# Patient Record
Sex: Female | Born: 1990 | Race: White | Hispanic: No | Marital: Single | Smoking: Former smoker
Health system: Southern US, Community
[De-identification: ages and names within clinical notes are randomized; demographics above are authoritative.]

## PROBLEM LIST (undated history)

## (undated) ENCOUNTER — Inpatient Hospital Stay (HOSPITAL_COMMUNITY): Payer: Self-pay

## (undated) DIAGNOSIS — R87629 Unspecified abnormal cytological findings in specimens from vagina: Secondary | ICD-10-CM

## (undated) HISTORY — PX: NO PAST SURGERIES: SHX2092

---

## 2011-06-15 ENCOUNTER — Emergency Department (INDEPENDENT_AMBULATORY_CARE_PROVIDER_SITE_OTHER): Payer: No Typology Code available for payment source

## 2011-06-15 ENCOUNTER — Encounter: Payer: Self-pay | Admitting: *Deleted

## 2011-06-15 ENCOUNTER — Emergency Department (HOSPITAL_BASED_OUTPATIENT_CLINIC_OR_DEPARTMENT_OTHER)
Admission: EM | Admit: 2011-06-15 | Discharge: 2011-06-15 | Disposition: A | Discharge status: Home / Self Care | Payer: No Typology Code available for payment source | Attending: Emergency Medicine | Admitting: Emergency Medicine

## 2011-06-15 DIAGNOSIS — S61411A Laceration without foreign body of right hand, initial encounter: Secondary | ICD-10-CM

## 2011-06-15 DIAGNOSIS — R079 Chest pain, unspecified: Secondary | ICD-10-CM

## 2011-06-15 DIAGNOSIS — M25449 Effusion, unspecified hand: Secondary | ICD-10-CM

## 2011-06-15 DIAGNOSIS — Y92410 Unspecified street and highway as the place of occurrence of the external cause: Secondary | ICD-10-CM | POA: Insufficient documentation

## 2011-06-15 DIAGNOSIS — S61409A Unspecified open wound of unspecified hand, initial encounter: Secondary | ICD-10-CM

## 2011-06-15 DIAGNOSIS — S20219A Contusion of unspecified front wall of thorax, initial encounter: Secondary | ICD-10-CM | POA: Insufficient documentation

## 2011-06-15 DIAGNOSIS — J45909 Unspecified asthma, uncomplicated: Secondary | ICD-10-CM | POA: Insufficient documentation

## 2011-06-15 MED ORDER — HYDROCODONE-ACETAMINOPHEN 5-325 MG PO TABS: 2.0000 | ORAL_TABLET | ORAL | Status: AC | PRN

## 2011-06-15 MED ORDER — LIDOCAINE HCL (PF) 1 % IJ SOLN
30.0000 mL | Freq: Once | INTRAMUSCULAR | Status: AC
  Administered 2011-06-15: 5 mL via INTRADERMAL

## 2011-06-15 MED ORDER — LIDOCAINE HCL (PF) 1 % IJ SOLN
INTRAMUSCULAR | Status: AC
  Filled 2011-06-15: qty 5

## 2011-06-15 NOTE — ED Notes (Signed)
PA at bedside.

## 2011-06-15 NOTE — ED Provider Notes (Signed)
Medical screening examination/treatment/procedure(s) were performed by non-physician practitioner and as supervising physician I was immediately available for consultation/collaboration.   Seraphim Affinito M Michaelangelo Mittelman, MD 06/15/11 2317 
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
## 2011-06-23 ENCOUNTER — Encounter (HOSPITAL_BASED_OUTPATIENT_CLINIC_OR_DEPARTMENT_OTHER): Payer: Self-pay | Admitting: Emergency Medicine

## 2011-06-23 ENCOUNTER — Emergency Department (HOSPITAL_BASED_OUTPATIENT_CLINIC_OR_DEPARTMENT_OTHER)
Admission: EM | Admit: 2011-06-23 | Discharge: 2011-06-23 | Disposition: A | Discharge status: Home / Self Care | Payer: No Typology Code available for payment source | Attending: Emergency Medicine | Admitting: Emergency Medicine

## 2011-06-23 DIAGNOSIS — Z79899 Other long term (current) drug therapy: Secondary | ICD-10-CM | POA: Insufficient documentation

## 2011-06-23 DIAGNOSIS — Z4802 Encounter for removal of sutures: Secondary | ICD-10-CM | POA: Insufficient documentation

## 2011-06-23 DIAGNOSIS — J45909 Unspecified asthma, uncomplicated: Secondary | ICD-10-CM | POA: Insufficient documentation

## 2011-06-23 NOTE — ED Notes (Signed)
Hand sutured site intact

## 2011-06-23 NOTE — ED Provider Notes (Signed)
History     CSN: [id number] Arrival date & time: 06/23/2011  3:32 PM   First MD Initiated Contact with Patient 06/23/11 1538      Chief Complaint  Patient presents with  . Suture / Staple Removal    sutures intact to R hand site unremarkable    (Consider location/radiation/quality/duration/timing/severity/associated sxs/prior treatment) Patient is a 20 y.o. female presenting with suture removal. The history is provided by the patient. No language interpreter was used.  Suture / Staple Removal  The sutures were placed 7 to 10 days ago. Her temperature was unmeasured prior to arrival. There has been no drainage from the wound. There is no redness present. There is no swelling present.    Past Medical History  Diagnosis Date  . Asthma     History reviewed. No pertinent past surgical history.  History reviewed. No pertinent family history.  History  Substance Use Topics  . Smoking status: Former Games developer  . Smokeless tobacco: Not on file  . Alcohol Use: No    OB History    Grav Para Term Preterm Abortions TAB SAB Ect Mult Living                  Review of Systems  Respiratory: Negative.   Cardiovascular: Negative.   Skin:       Pt here to have sutures removed to the right hand    Allergies  Review of patient's allergies indicates no known allergies.  Home Medications   Current Outpatient Rx  Name Route Sig Dispense Refill  . BUPROPION HCL ER (XL) 300 MG PO TB24 Oral Take 300 mg by mouth daily.      Marland Kitchen FLUOXETINE HCL 20 MG PO CAPS Oral Take 20 mg by mouth daily.      Marland Kitchen HYDROCODONE-ACETAMINOPHEN 5-325 MG PO TABS Oral Take 2 tablets by mouth every 4 (four) hours as needed for pain. 10 tablet 0  . NORGESTIM-ETH ESTRAD TRIPHASIC 0.18/0.215/0.25 MG-35 MCG PO TABS Oral Take 1 tablet by mouth daily.      . BUPROPION HCL ER (SR) 100 MG PO TB12 Oral Take 100 mg by mouth 2 (two) times daily.      . BUPROPION HCL ER (XL) 150 MG PO TB24 Oral Take 150 mg by mouth daily.        Marland Kitchen FLUOXETINE HCL 10 MG PO CAPS Oral Take 10 mg by mouth daily.        BP 123/80  Pulse 88  Temp 97.8 F (36.6 C)  Resp 20  SpO2 98%  LMP 05/27/2011  Physical Exam  Nursing note and vitals reviewed. Constitutional: She appears well-developed and well-nourished.  Cardiovascular: Normal rate and regular rhythm.   Pulmonary/Chest: Effort normal and breath sounds normal.  Musculoskeletal:       Pt has well healed wound to the right hand    ED Course  SUTURE REMOVAL Performed by: Teressa Lower Authorized by: Teressa Lower Consent given by: patient Body area: upper extremity Location details: right hand Wound Appearance: clean Sutures Removed: 2   (including critical care time)  Labs Reviewed - No data to display No results found.   1. Visit for suture removal       MDM  Sutures removed without any problem        Teressa Lower, NP 06/23/11 1619

## 2011-06-28 NOTE — ED Provider Notes (Signed)
Medical screening examination/treatment/procedure(s) were performed by non-physician practitioner and as supervising physician I was immediately available for consultation/collaboration.  Mang Hazelrigg W. Tana Trefry, MD 06/28/11 0929 

## 2018-01-04 ENCOUNTER — Encounter (HOSPITAL_COMMUNITY): Payer: Self-pay | Admitting: *Deleted

## 2018-01-04 ENCOUNTER — Inpatient Hospital Stay (HOSPITAL_COMMUNITY)
Admission: AD | Admit: 2018-01-04 | Discharge: 2018-01-04 | Disposition: A | Discharge status: Home / Self Care | Payer: Medicaid Other | Source: Ambulatory Visit | Attending: Obstetrics & Gynecology | Admitting: Obstetrics & Gynecology

## 2018-01-04 ENCOUNTER — Inpatient Hospital Stay (HOSPITAL_COMMUNITY): Payer: Medicaid Other

## 2018-01-04 DIAGNOSIS — O209 Hemorrhage in early pregnancy, unspecified: Secondary | ICD-10-CM | POA: Diagnosis not present

## 2018-01-04 DIAGNOSIS — Z3A01 Less than 8 weeks gestation of pregnancy: Secondary | ICD-10-CM | POA: Insufficient documentation

## 2018-01-04 DIAGNOSIS — M25511 Pain in right shoulder: Secondary | ICD-10-CM | POA: Diagnosis not present

## 2018-01-04 DIAGNOSIS — Z87891 Personal history of nicotine dependence: Secondary | ICD-10-CM | POA: Insufficient documentation

## 2018-01-04 DIAGNOSIS — O30041 Twin pregnancy, dichorionic/diamniotic, first trimester: Secondary | ICD-10-CM | POA: Diagnosis not present

## 2018-01-04 DIAGNOSIS — R1031 Right lower quadrant pain: Secondary | ICD-10-CM | POA: Insufficient documentation

## 2018-01-04 LAB — URINALYSIS, ROUTINE W REFLEX MICROSCOPIC
Bilirubin Urine: NEGATIVE
Glucose, UA: NEGATIVE mg/dL
KETONES UR: NEGATIVE mg/dL
LEUKOCYTES UA: NEGATIVE
Nitrite: NEGATIVE
PROTEIN: NEGATIVE mg/dL
Specific Gravity, Urine: 1.01 (ref 1.005–1.030)
pH: 6 (ref 5.0–8.0)

## 2018-01-04 LAB — CBC
HCT: 35.9 % — ABNORMAL LOW (ref 36.0–46.0)
Hemoglobin: 12.8 g/dL (ref 12.0–15.0)
MCH: 32.6 pg (ref 26.0–34.0)
MCHC: 35.7 g/dL (ref 30.0–36.0)
MCV: 91.3 fL (ref 78.0–100.0)
Platelets: 220 10*3/uL (ref 150–400)
RBC: 3.93 MIL/uL (ref 3.87–5.11)
RDW: 11.8 % (ref 11.5–15.5)
WBC: 8.8 10*3/uL (ref 4.0–10.5)

## 2018-01-04 LAB — ABO/RH: ABO/RH(D): A POS

## 2018-01-04 LAB — WET PREP, GENITAL
Clue Cells Wet Prep HPF POC: NONE SEEN
Sperm: NONE SEEN
TRICH WET PREP: NONE SEEN
YEAST WET PREP: NONE SEEN

## 2018-01-04 LAB — POCT PREGNANCY, URINE: PREG TEST UR: POSITIVE — AB

## 2018-01-04 LAB — HCG, QUANTITATIVE, PREGNANCY: hCG, Beta Chain, Quant, S: 22659 m[IU]/mL — ABNORMAL HIGH (ref <5)

## 2018-01-04 NOTE — MAU Note (Signed)
Pt reports she is [redacted] weeks pregnant, spotting off/on x 2 weeks, right shoulder pain x one week and cramping for 24 hours.

## 2018-01-04 NOTE — MAU Provider Note (Signed)
History     CSN: [id number]  Arrival date and time: 01/04/18 1438   First Provider Initiated Contact with Patient 01/04/18 1628      Chief Complaint  Patient presents with  . Abdominal Pain  . Vaginal Bleeding  . Shoulder Pain   HPI Crystal Terry 27 y.o. [redacted]w[redacted]d by sure LMP.  Has been having pink bleeding when she wipes every few days for the past 6 weeks.  Today, she has had RLQ pain 7/10 and pain in her right shoulder.  She was worried and come in for evaluation.  She has applied for Medicaid but does not yet have her card.  She does not yet have an appointment for prenatal care.  Has been having some nausea but no vomiting.  Client is concerned that her symptoms are indicative of ectopic pregnancy.  OB History    Gravida  1   Para      Term      Preterm      AB      Living        SAB      TAB      Ectopic      Multiple      Live Births              Past Medical History:  Diagnosis Date  . Asthma     History reviewed. No pertinent surgical history.  History reviewed. No pertinent family history.  Social History   Tobacco Use  . Smoking status: Former Games developer  . Smokeless tobacco: Never Used  Substance Use Topics  . Alcohol use: No  . Drug use: No    Allergies: No Known Allergies  Medications Prior to Admission  Medication Sig Dispense Refill Last Dose  . buPROPion (WELLBUTRIN SR) 100 MG 12 hr tablet Take 100 mg by mouth 2 (two) times daily.       Marland Kitchen buPROPion (WELLBUTRIN XL) 150 MG 24 hr tablet Take 150 mg by mouth daily.     06/15/2011 at Unknown  . buPROPion (WELLBUTRIN XL) 300 MG 24 hr tablet Take 300 mg by mouth daily.     06/23/2011 at Unknown  . FLUoxetine (PROZAC) 10 MG capsule Take 10 mg by mouth daily.     06/15/2011 at Unknown  . FLUoxetine (PROZAC) 20 MG capsule Take 20 mg by mouth daily.     06/23/2011 at Unknown  . Norgestimate-Ethinyl Estradiol Triphasic (ORTHO TRI-CYCLEN, 28,) 0.18/0.215/0.25 MG-35 MCG tablet Take 1 tablet by  mouth daily.     Past Week at Unknown    Review of Systems  Constitutional: Negative for fever.  Gastrointestinal: Positive for abdominal pain and nausea. Negative for diarrhea and vomiting.  Genitourinary: Positive for vaginal bleeding. Negative for dysuria and vaginal discharge.  Musculoskeletal:       Pain in right shoulder   Physical Exam   Blood pressure 126/66, pulse 77, temperature 98.6 F (37 C), temperature source Oral, resp. rate 15, height 6' (1.829 m), weight 144 lb (65.3 kg), last menstrual period 11/11/2017, SpO2 100 %.  Physical Exam  Nursing note and vitals reviewed. Constitutional: She is oriented to person, place, and time. She appears well-developed and well-nourished.  HENT:  Head: Normocephalic.  Eyes: EOM are normal.  Neck: Neck supple.  GI: Soft. There is tenderness. There is no rebound and no guarding.  Pain in RLQ with gentle palpation  Genitourinary:  Genitourinary Comments: Speculum exam: Vagina - Small amount of white discharge, no odor, no  blood seen Cervix - No contact bleeding Bimanual exam: Cervix closed and thick Uterus non tender, normal size Adnexa slightly tender on the right side, no tenderness on the left side, no masses bilaterally GC/Chlam, wet prep done Chaperone present for exam.   Musculoskeletal: Normal range of motion.  Neurological: She is alert and oriented to person, place, and time.  Skin: Skin is warm and dry.  Psychiatric: She has a normal mood and affect.    MAU Course  Procedures Results for orders placed or performed during the hospital encounter of 01/04/18 (from the past 24 hour(s))  Urinalysis, Routine w reflex microscopic     Status: Abnormal   Collection Time: 01/04/18  3:25 PM  Result Value Ref Range   Color, Urine YELLOW YELLOW   APPearance HAZY (A) CLEAR   Specific Gravity, Urine 1.010 1.005 - 1.030   pH 6.0 5.0 - 8.0   Glucose, UA NEGATIVE NEGATIVE mg/dL   Hgb urine dipstick SMALL (A) NEGATIVE    Bilirubin Urine NEGATIVE NEGATIVE   Ketones, ur NEGATIVE NEGATIVE mg/dL   Protein, ur NEGATIVE NEGATIVE mg/dL   Nitrite NEGATIVE NEGATIVE   Leukocytes, UA NEGATIVE NEGATIVE   RBC / HPF 0-5 0 - 5 RBC/hpf   WBC, UA 0-5 0 - 5 WBC/hpf   Bacteria, UA MANY (A) NONE SEEN   Squamous Epithelial / LPF 0-5 0 - 5  Pregnancy, urine POC     Status: Abnormal   Collection Time: 01/04/18  3:38 PM  Result Value Ref Range   Preg Test, Ur POSITIVE (A) NEGATIVE  Wet prep, genital     Status: Abnormal   Collection Time: 01/04/18  4:41 PM  Result Value Ref Range   Yeast Wet Prep HPF POC NONE SEEN NONE SEEN   Trich, Wet Prep NONE SEEN NONE SEEN   Clue Cells Wet Prep HPF POC NONE SEEN NONE SEEN   WBC, Wet Prep HPF POC MODERATE (A) NONE SEEN   Sperm NONE SEEN   CBC     Status: Abnormal   Collection Time: 01/04/18  4:44 PM  Result Value Ref Range   WBC 8.8 4.0 - 10.5 K/uL   RBC 3.93 3.87 - 5.11 MIL/uL   Hemoglobin 12.8 12.0 - 15.0 g/dL   HCT 78.2 (L) 95.6 - 21.3 %   MCV 91.3 78.0 - 100.0 fL   MCH 32.6 26.0 - 34.0 pg   MCHC 35.7 30.0 - 36.0 g/dL   RDW 08.6 57.8 - 46.9 %   Platelets 220 150 - 400 K/uL  hCG, quantitative, pregnancy     Status: Abnormal   Collection Time: 01/04/18  4:44 PM  Result Value Ref Range   hCG, Beta Chain, Quant, S 22,659 (H) <5 mIU/mL  ABO/Rh     Status: None (Preliminary result)   Collection Time: 01/04/18  4:44 PM  Result Value Ref Range   ABO/RH(D)      A POS Performed at [facility], [address], [place], [place] [address]      CLINICAL DATA:  First trimester of pregnancy, vaginal bleeding.  EXAM: TWIN OBSTETRIC <14WK [place] AND TRANSVAGINAL OB US  COMPARISON:  None.  FINDINGS: Number of IUPs:  2  Chorionicity/Amnionicity:  Dichorionic-diamniotic (thick membrane)  TWIN 1  Yolk sac:  Not visualized.  Embryo:  Not visualized.  Cardiac Activity: Not visualized.  MSD: 14 mm   6 w   2 d  TWIN 2  Yolk sac:  Not visualized.  Embryo:   Visualized.  Cardiac Activity: Visualized.  Heart Rate: 111 bpm  CRL:  7 mm   6 w 3 d                  [place] EDC: August 27, 2018.  Subchorionic hemorrhage:  None visualized.  Maternal uterus/adnexae: Ovaries appear normal. No free fluid is noted.  IMPRESSION: Twin pregnancy is noted.  Twin 1: Probable intrauterine gestational sac, but no yolk sac, fetal pole, or cardiac activity yet visualized. Recommend follow-up quantitative B-HCG levels and follow-up US in 14 days to assess viability. This recommendation follows SRU consensus guidelines: Diagnostic Criteria for Nonviable Pregnancy Early in the First Trimester. Malva Limes Med 2013; 308:6578-46.  Twin 2: Single live intrauterine gestation of 6 weeks 3 days.   MDM Discussed at length the ultrasound results with one living embryo with FHT of 111.  No reason for the vaginal bleeding identified. Blood type A positive and rhogam is not needed  Assessment and Plan  Twin gestation (Di-Di) with one sac with living embryo and one empty sac that is [redacted]w[redacted]d and not the [redacted]w[redacted]d pregnancy by her sure LMP Vaginal bleeding in first trimester - no known cause. Abdominal pain in pregnancy - no known cause Right shoulder pain  Plan Begin prenatal care as soon as possible  - message sent to clinic at [facility]. Continue prenatal vitamins you are taking. Return with heavy vaginal bleeding or worsening pain. Pelvic rest for now as there is no identifiable reason for the vaginal bleeding. Note for work given to limit lifting to 25 pounds as she is currently lifting over her head 50-100 pounds at work   This lifting may be contributing to her shoulder pain today.   Lendell Gallick L Lurene Robley 01/04/2018, 4:28 PM

## 2018-01-05 LAB — RPR: RPR: NONREACTIVE

## 2018-01-05 LAB — GC/CHLAMYDIA PROBE AMP (~~LOC~~) NOT AT ARMC
Chlamydia: NEGATIVE
NEISSERIA GONORRHEA: NEGATIVE

## 2018-01-05 LAB — HIV ANTIBODY (ROUTINE TESTING W REFLEX): HIV Screen 4th Generation wRfx: NONREACTIVE

## 2018-01-06 ENCOUNTER — Telehealth: Payer: Self-pay | Admitting: Obstetrics and Gynecology

## 2018-01-06 NOTE — Telephone Encounter (Signed)
Called patient, and left a detailed message about her appointment time and date.

## 2018-01-21 ENCOUNTER — Ambulatory Visit (INDEPENDENT_AMBULATORY_CARE_PROVIDER_SITE_OTHER): Payer: Medicaid Other | Admitting: Obstetrics and Gynecology

## 2018-01-21 ENCOUNTER — Encounter: Payer: Self-pay | Admitting: Obstetrics and Gynecology

## 2018-01-21 VITALS — BP 121/59 | HR 57 | Wt 146.1 lb

## 2018-01-21 DIAGNOSIS — Z113 Encounter for screening for infections with a predominantly sexual mode of transmission: Secondary | ICD-10-CM | POA: Diagnosis not present

## 2018-01-21 DIAGNOSIS — Z23 Encounter for immunization: Secondary | ICD-10-CM

## 2018-01-21 DIAGNOSIS — O0990 Supervision of high risk pregnancy, unspecified, unspecified trimester: Secondary | ICD-10-CM | POA: Insufficient documentation

## 2018-01-21 DIAGNOSIS — Z8279 Family history of other congenital malformations, deformations and chromosomal abnormalities: Secondary | ICD-10-CM | POA: Diagnosis not present

## 2018-01-21 DIAGNOSIS — Z124 Encounter for screening for malignant neoplasm of cervix: Secondary | ICD-10-CM

## 2018-01-21 DIAGNOSIS — Z3401 Encounter for supervision of normal first pregnancy, first trimester: Secondary | ICD-10-CM | POA: Diagnosis present

## 2018-01-21 DIAGNOSIS — O3121X0 Continuing pregnancy after intrauterine death of one fetus or more, first trimester, not applicable or unspecified: Secondary | ICD-10-CM

## 2018-01-21 DIAGNOSIS — O3110X0 Continuing pregnancy after spontaneous abortion of one fetus or more, unspecified trimester, not applicable or unspecified: Secondary | ICD-10-CM

## 2018-01-21 NOTE — Progress Notes (Signed)
Bedside US for viability:  IUGS w/1 fetal pole visualized, FHR - 170 bpm per M-mode. CRL - 2.23 cm  (8w 6d). Second smaller gestational sac visualized with no fetal pole present.

## 2018-01-22 DIAGNOSIS — O3110X0 Continuing pregnancy after spontaneous abortion of one fetus or more, unspecified trimester, not applicable or unspecified: Secondary | ICD-10-CM | POA: Insufficient documentation

## 2018-01-22 DIAGNOSIS — Z8279 Family history of other congenital malformations, deformations and chromosomal abnormalities: Secondary | ICD-10-CM | POA: Insufficient documentation

## 2018-01-22 NOTE — Progress Notes (Addendum)
New OB Note  01/21/2018   Clinic: [facility]  Chief Complaint: NOB  Transfer of Care Patient: no  History of Present Illness: Ms. Crystal Terry is a 27 y.o. G1P0000 @ 8/6 weeks (EDC 12/21, based on 6wk u/s)  Patient's last menstrual period was 11/11/2017.Preg complicated by has Supervision of low-risk first pregnancy; Vanishing twin syndrome; and Family history of fragile X syndrome on their problem list.   Any events prior to today's visit: yes:  Vanishing twin (di-di) Her periods were: qmonth, regular She was using no method when she conceived.  She has mild signs or symptoms of nausea/vomiting of pregnancy. She has Negative signs or symptoms of miscarriage or preterm labor On any medications around the time she conceived/early pregnancy: No   ROS: A 12-point review of systems was performed and negative, except as stated in the above HPI.  OBGYN History: As per HPI. OB History  Gravida Para Term Preterm AB Living  1 0 0 0 0 0  SAB TAB Ectopic Multiple Live Births  0 0 0 0 0    # Outcome Date GA Lbr Len/2nd Weight Sex Delivery Anes PTL Lv  1 Current             Any issues with any prior pregnancies: not applicable Prior children are healthy, doing well, and without any problems or issues: not applicable History of pap smears: No. Last pap smear unsure    Past Medical History: Past Medical History:  Diagnosis Date  . Asthma     Past Surgical History: Past Surgical History:  Procedure Laterality Date  . NO PAST SURGERIES      Family History:  Family History  Problem Relation Age of Onset  . Diabetes Maternal Grandmother   . Heart disease Maternal Grandmother   . Diabetes Maternal Grandfather   . Heart disease Maternal Grandfather    She denies any female cancers, bleeding or blood clotting disorders.  She denies any history of mental retardation, birth defects or genetic disorders in her or the FOB's history except for a cousin of her's with  fragile x.  Social History:  Social History   Socioeconomic History  . Marital status: Single    Spouse name: Not on file  . Number of children: Not on file  . Years of education: Not on file  . Highest education level: Not on file  Occupational History  . Not on file  Social Needs  . Financial resource strain: Not on file  . Food insecurity:    Worry: Not on file    Inability: Not on file  . Transportation needs:    Medical: Not on file    Non-medical: Not on file  Tobacco Use  . Smoking status: Former Games developer  . Smokeless tobacco: Never Used  Substance and Sexual Activity  . Alcohol use: No  . Drug use: No  . Sexual activity: Yes  Lifestyle  . Physical activity:    Days per week: Not on file    Minutes per session: Not on file  . Stress: Not on file  Relationships  . Social connections:    Talks on phone: Not on file    Gets together: Not on file    Attends religious service: Not on file    Active member of club or organization: Not on file    Attends meetings of clubs or organizations: Not on file    Relationship status: Not on file  . Intimate partner violence:  Fear of current or ex partner: Not on file    Emotionally abused: Not on file    Physically abused: Not on file    Forced sexual activity: Not on file  Other Topics Concern  . Not on file  Social History Narrative  . Not on file  pet works with pets  Allergy: No Known Allergies  Health Maintenance:  Mammogram Up to Date: not applicable  Current Outpatient Medications: PNV  Physical Exam:   BP (!) 121/59   Pulse (!) 57   Wt 146 lb 1.6 oz (66.3 kg)   LMP 11/11/2017   BMI 19.81 kg/m  Body mass index is 19.81 kg/m. Contractions: Not present Vag. Bleeding: None. Fundal height: not applicable FHTs: 170s  General appearance: Well nourished, well developed female in no acute distress.  Neck:  Supple, normal appearance, and no thyromegaly  Cardiovascular: S1, S2 normal, no murmur, rub  or gallop, regular rate and rhythm. HR 70s Respiratory:  Clear to auscultation bilateral. Normal respiratory effort Abdomen: positive bowel sounds and no masses, hernias; diffusely non tender to palpation, non distended Breasts: patient denies any abnormal breast s/s. Neuro/Psych:  Normal mood and affect.  Skin:  Warm and dry.  Lymphatic:  No inguinal lymphadenopathy.   Pelvic exam: is not limited by body habitus EGBUS: within normal limits, Vagina: within normal limits and with no blood in the vault, Cervix: normal appearing cervix without discharge or lesions, closed/long/high, Uterus:  8-10wk sized, and Adnexa:  normal adnexa and no mass, fullness, tenderness  Laboratory: MAU labs reviewed  Imaging:  SLIUP at 8-9wks on u/s today.   Assessment: pt doing well  Plan: 1. Encounter for supervision of low-risk first pregnancy in first trimester Routine care. Pt would like genetics. Will do NT only (no labs) due to vanishing twin. Work note given to avoid toxo exposure until ab testing comes back.  - Culture, OB Urine - Cystic fibrosis gene test - Cytology - PAP - Obstetric Panel, Including HIV - Hemoglobinopathy Evaluation - SMN1 COPY NUMBER ANALYSIS (SMA Carrier Screen) - Flu Vaccine QUAD 36+ mos IM - CHL AMB BABYSCRIPTS SCHEDULE OPTIMIZATION - Toxoplasma gondii antibody, IgG - [place] MFM Fetal Nuchal Translucency; Future - Fragile X, PCR reflex Southern  2. Family history of fragile X syndrome  - Fragile X, PCR reflex [place]  3. Vanishing twin syndrome - [place] MFM Fetal Nuchal Translucency; Future  Problem list reviewed and updated.  Follow up in 3 weeks.  The nature of [facility] with multiple MDs and other Advanced Practice Providers was explained to patient; also emphasized that residents, students are part of our team.  >50% of 25 min visit spent on counseling and coordination of care.     Cornelia Copa MD Attending Center  for [facility])

## 2018-01-24 LAB — CULTURE, OB URINE

## 2018-01-24 LAB — URINE CULTURE, OB REFLEX

## 2018-01-25 ENCOUNTER — Encounter: Payer: Self-pay | Admitting: Obstetrics and Gynecology

## 2018-01-25 DIAGNOSIS — R87612 Low grade squamous intraepithelial lesion on cytologic smear of cervix (LGSIL): Secondary | ICD-10-CM | POA: Insufficient documentation

## 2018-01-25 LAB — CYTOLOGY - PAP
CHLAMYDIA, DNA PROBE: NEGATIVE
Neisseria Gonorrhea: NEGATIVE

## 2018-01-27 LAB — FRAGILE X, PCR REFLEX SOUTHERN

## 2018-02-01 LAB — SMN1 COPY NUMBER ANALYSIS (SMA CARRIER SCREENING)

## 2018-02-01 LAB — OBSTETRIC PANEL, INCLUDING HIV
ANTIBODY SCREEN: NEGATIVE
BASOS: 1 %
Basophils Absolute: 0 10*3/uL (ref 0.0–0.2)
EOS (ABSOLUTE): 0.1 10*3/uL (ref 0.0–0.4)
Eos: 2 %
HEMATOCRIT: 34.9 % (ref 34.0–46.6)
HEMOGLOBIN: 11.8 g/dL (ref 11.1–15.9)
HEP B S AG: NEGATIVE
HIV Screen 4th Generation wRfx: NONREACTIVE
IMMATURE GRANS (ABS): 0 10*3/uL (ref 0.0–0.1)
IMMATURE GRANULOCYTES: 0 %
LYMPHS: 29 %
Lymphocytes Absolute: 2.2 10*3/uL (ref 0.7–3.1)
MCH: 32 pg (ref 26.6–33.0)
MCHC: 33.8 g/dL (ref 31.5–35.7)
MCV: 95 fL (ref 79–97)
MONOS ABS: 0.7 10*3/uL (ref 0.1–0.9)
Monocytes: 9 %
NEUTROS PCT: 59 %
Neutrophils Absolute: 4.7 10*3/uL (ref 1.4–7.0)
Platelets: 208 10*3/uL (ref 150–379)
RBC: 3.69 x10E6/uL — AB (ref 3.77–5.28)
RDW: 12 % — ABNORMAL LOW (ref 12.3–15.4)
RH TYPE: POSITIVE
RPR: NONREACTIVE
Rubella Antibodies, IGG: 1.54 index (ref >0.99)
WBC: 7.8 10*3/uL (ref 3.4–10.8)

## 2018-02-01 LAB — CYSTIC FIBROSIS GENE TEST

## 2018-02-01 LAB — HEMOGLOBINOPATHY EVALUATION
Ferritin: 56 ng/mL (ref 15–150)
HGB A2 QUANT: 2.4 % (ref 1.8–3.2)
HGB A: 97.6 % (ref 96.4–98.8)
HGB F QUANT: 0 % (ref 0.0–2.0)
HGB S: 0 %
HGB VARIANT: 0 %
Hgb C: 0 %
Hgb Solubility: NEGATIVE

## 2018-02-01 LAB — TOXOPLASMA GONDII ANTIBODY, IGG

## 2018-02-11 ENCOUNTER — Telehealth: Payer: Self-pay | Admitting: Obstetrics and Gynecology

## 2018-02-11 NOTE — Telephone Encounter (Signed)
OB Telephone Note  Patient called at [phone] and VM picked up as "Crystal Terry" VM left asking her to call the clinic.  Will need to tell patient re: need for colposcopy and that she is NOT toxoplasmosis immune, since she works at a [employer]. Can do her colposcopy with her next OB visit on 6/18.  [name], Jr MD Attending Center for [facility] 02/11/2018 Time: [id number]

## 2018-02-11 NOTE — Telephone Encounter (Signed)
OB Telephone Note  D/w pt re: need for colpo and that she is toxo non immune.  Crystal Terry, Jr MD Attending [facility] 02/11/2018 Time: [id number]

## 2018-02-14 ENCOUNTER — Encounter (HOSPITAL_COMMUNITY): Payer: Self-pay

## 2018-02-21 ENCOUNTER — Ambulatory Visit (HOSPITAL_COMMUNITY)
Admission: RE | Admit: 2018-02-21 | Discharge: 2018-02-21 | Disposition: A | Discharge status: Home / Self Care | Payer: Medicaid Other | Source: Ambulatory Visit | Attending: Obstetrics and Gynecology | Admitting: Obstetrics and Gynecology

## 2018-02-21 ENCOUNTER — Encounter (HOSPITAL_COMMUNITY): Payer: Self-pay

## 2018-02-21 DIAGNOSIS — O99511 Diseases of the respiratory system complicating pregnancy, first trimester: Secondary | ICD-10-CM | POA: Insufficient documentation

## 2018-02-21 DIAGNOSIS — Z3A13 13 weeks gestation of pregnancy: Secondary | ICD-10-CM | POA: Diagnosis not present

## 2018-02-21 DIAGNOSIS — O3110X0 Continuing pregnancy after spontaneous abortion of one fetus or more, unspecified trimester, not applicable or unspecified: Secondary | ICD-10-CM

## 2018-02-21 DIAGNOSIS — J45909 Unspecified asthma, uncomplicated: Secondary | ICD-10-CM | POA: Insufficient documentation

## 2018-02-21 DIAGNOSIS — Z3401 Encounter for supervision of normal first pregnancy, first trimester: Secondary | ICD-10-CM

## 2018-02-21 HISTORY — DX: Unspecified abnormal cytological findings in specimens from vagina: R87.629

## 2018-02-22 ENCOUNTER — Encounter: Payer: Self-pay | Admitting: Obstetrics and Gynecology

## 2018-03-02 ENCOUNTER — Ambulatory Visit (INDEPENDENT_AMBULATORY_CARE_PROVIDER_SITE_OTHER): Payer: Medicaid Other | Admitting: Obstetrics and Gynecology

## 2018-03-02 ENCOUNTER — Encounter: Payer: Self-pay | Admitting: Obstetrics and Gynecology

## 2018-03-02 VITALS — BP 132/80 | HR 101 | Wt 147.2 lb

## 2018-03-02 DIAGNOSIS — Z3400 Encounter for supervision of normal first pregnancy, unspecified trimester: Secondary | ICD-10-CM | POA: Diagnosis not present

## 2018-03-02 DIAGNOSIS — G4709 Other insomnia: Secondary | ICD-10-CM | POA: Diagnosis not present

## 2018-03-02 DIAGNOSIS — R87612 Low grade squamous intraepithelial lesion on cytologic smear of cervix (LGSIL): Secondary | ICD-10-CM | POA: Diagnosis not present

## 2018-03-02 MED ORDER — ZOLPIDEM TARTRATE 5 MG PO TABS: 5.0000 mg | ORAL_TABLET | Freq: Every evening | ORAL | 0 refills | Status: DC | PRN

## 2018-03-02 NOTE — Progress Notes (Signed)
Prenatal Visit Note Date: 03/02/2018 Clinic: [facility]  Subjective:  Ames Coupemily Depasquale is a 27 y.o. G1P0000 at 2358w4d being seen today for ongoing prenatal care.  She is currently monitored for the following issues for this low-risk pregnancy and has Supervision of low-risk first pregnancy; Vanishing twin syndrome; Family history of fragile X syndrome; and LGSIL on Pap smear of cervix on their problem list.  Patient reports insomnia.   Contractions: Not present. Vag. Bleeding: None.   . Denies leaking of fluid.   The following portions of the patient's history were reviewed and updated as appropriate: allergies, current medications, past family history, past medical history, past social history, past surgical history and problem list. Problem list updated.  Objective:   Vitals:   03/02/18 0936 03/02/18 0937  BP: (!) 142/82 132/80  Pulse: (!) 112 (!) 101  Weight: 147 lb 3.2 oz (66.8 kg)     Fetal Status: Fetal Heart Rate (bpm): 160         General:  Alert, oriented and cooperative. Patient is in no acute distress.  Skin: Skin is warm and dry. No rash noted.   Cardiovascular: Normal heart rate noted  Respiratory: Normal respiratory effort, no problems with respiration noted  Abdomen: Soft, gravid, appropriate for gestational age. Pain/Pressure: Absent     Pelvic:  Cervical exam deferred        Extremities: Normal range of motion.  Edema: None  Mental Status: Normal mood and affect. Normal behavior. Normal judgment and thought content.   Urinalysis:      Assessment and Plan:  Pregnancy: G1P0000 at 6858w4d  1. Encounter for supervision of low-risk first pregnancy, antepartum Routine care. Schedule anatomy u/s. Offer afp nv - US MFM OB COMP + 14 WK; Future  2. Other insomnia Had issues in the past. Issues with falling asleep and staying asleep. Caffeine only in the morning. Strategies d/w her and has tried benadryl but no help.   3. LGSIL on Pap smear of  cervix colpo done c/w cin 1. Repeat pp. No bx.   Preterm labor symptoms and general obstetric precautions including but not limited to vaginal bleeding, contractions, leaking of fluid and fetal movement were reviewed in detail with the patient. Please refer to After Visit Summary for other counseling recommendations.  Return for per babyscripts.   [employer] BingPickens, Neesha Langton, MD

## 2018-03-02 NOTE — Procedures (Signed)
Colposcopy Procedure Note  Pre-operative Diagnosis: LSIL. 14/4 weeks  Post-operative Diagnosis: Same. CIN 1  Procedure Details    The risks (including infection, bleeding, pain) and benefits of the procedure were explained to the patient and written informed consent was obtained.  The patient was placed in the dorsal lithotomy position. A Graves was speculum inserted in the vagina, and the cervix was visualized.  AA staining done Lugol's with green filter.  Biopsy not done and then single toothed tenaculum applied and ECC not done. No bleeding after procedure  Findings: AWE changes circumferentially c/w CIN 1  Adequate: yes  Specimens: none  Condition: Stable  Complications: None  Plan: The patient was advised to call for any fever or for prolonged or severe pain or bleeding. She was advised to use OTC analgesics as needed for mild to moderate pain. She was advised to avoid vaginal intercourse for 48 hours or until the bleeding has completely stopped.  D/w pt and recommend repeat colpo PP   Crystal Terry, Jr MD Attending [facility](Faculty Practice)

## 2018-03-11 ENCOUNTER — Other Ambulatory Visit (HOSPITAL_COMMUNITY): Payer: Self-pay

## 2018-03-18 ENCOUNTER — Encounter: Payer: Self-pay | Admitting: *Deleted

## 2018-04-04 ENCOUNTER — Other Ambulatory Visit (HOSPITAL_COMMUNITY): Payer: Self-pay | Admitting: Obstetrics and Gynecology

## 2018-04-04 ENCOUNTER — Ambulatory Visit (HOSPITAL_COMMUNITY)
Admission: RE | Admit: 2018-04-04 | Discharge: 2018-04-04 | Disposition: A | Discharge status: Home / Self Care | Payer: Medicaid Other | Source: Ambulatory Visit | Attending: Obstetrics and Gynecology | Admitting: Obstetrics and Gynecology

## 2018-04-04 ENCOUNTER — Other Ambulatory Visit: Payer: Self-pay | Admitting: Obstetrics and Gynecology

## 2018-04-04 ENCOUNTER — Other Ambulatory Visit (HOSPITAL_COMMUNITY): Payer: Self-pay

## 2018-04-04 ENCOUNTER — Encounter (HOSPITAL_COMMUNITY): Payer: Self-pay

## 2018-04-04 ENCOUNTER — Inpatient Hospital Stay (HOSPITAL_COMMUNITY): Admission: RE | Admit: 2018-04-04 | Payer: Medicaid Other | Source: Ambulatory Visit

## 2018-04-04 DIAGNOSIS — J45909 Unspecified asthma, uncomplicated: Secondary | ICD-10-CM | POA: Insufficient documentation

## 2018-04-04 DIAGNOSIS — O359XX0 Maternal care for (suspected) fetal abnormality and damage, unspecified, not applicable or unspecified: Secondary | ICD-10-CM

## 2018-04-04 DIAGNOSIS — O99512 Diseases of the respiratory system complicating pregnancy, second trimester: Secondary | ICD-10-CM | POA: Diagnosis not present

## 2018-04-04 DIAGNOSIS — Z3A19 19 weeks gestation of pregnancy: Secondary | ICD-10-CM | POA: Diagnosis present

## 2018-04-04 DIAGNOSIS — Z3400 Encounter for supervision of normal first pregnancy, unspecified trimester: Secondary | ICD-10-CM

## 2018-04-04 DIAGNOSIS — Z363 Encounter for antenatal screening for malformations: Secondary | ICD-10-CM

## 2018-04-06 ENCOUNTER — Telehealth: Payer: Self-pay | Admitting: General Practice

## 2018-04-06 ENCOUNTER — Encounter: Payer: Self-pay | Admitting: Obstetrics & Gynecology

## 2018-04-06 ENCOUNTER — Other Ambulatory Visit (HOSPITAL_COMMUNITY): Payer: Self-pay | Admitting: *Deleted

## 2018-04-06 DIAGNOSIS — O35AXX0 Maternal care for other (suspected) fetal abnormality and damage, fetal facial anomalies, not applicable or unspecified: Secondary | ICD-10-CM

## 2018-04-06 DIAGNOSIS — O358XX0 Maternal care for other (suspected) fetal abnormality and damage, not applicable or unspecified: Secondary | ICD-10-CM

## 2018-04-06 NOTE — Telephone Encounter (Signed)
Left message on VM for patient to give our office a call back in regards to follow up appt.

## 2018-04-11 ENCOUNTER — Encounter: Payer: Self-pay | Admitting: Obstetrics and Gynecology

## 2018-04-11 DIAGNOSIS — Q369 Cleft lip, unilateral: Secondary | ICD-10-CM | POA: Insufficient documentation

## 2018-04-15 ENCOUNTER — Other Ambulatory Visit (HOSPITAL_COMMUNITY): Payer: Self-pay

## 2018-04-21 ENCOUNTER — Ambulatory Visit (INDEPENDENT_AMBULATORY_CARE_PROVIDER_SITE_OTHER): Payer: Medicaid Other | Admitting: Obstetrics & Gynecology

## 2018-04-21 ENCOUNTER — Telehealth (HOSPITAL_COMMUNITY): Payer: Self-pay | Admitting: *Deleted

## 2018-04-21 VITALS — Wt 164.1 lb

## 2018-04-21 DIAGNOSIS — O358XX0 Maternal care for other (suspected) fetal abnormality and damage, not applicable or unspecified: Secondary | ICD-10-CM

## 2018-04-21 DIAGNOSIS — Z8279 Family history of other congenital malformations, deformations and chromosomal abnormalities: Secondary | ICD-10-CM

## 2018-04-21 DIAGNOSIS — R87612 Low grade squamous intraepithelial lesion on cytologic smear of cervix (LGSIL): Secondary | ICD-10-CM

## 2018-04-21 DIAGNOSIS — O35AXX0 Maternal care for other (suspected) fetal abnormality and damage, fetal facial anomalies, not applicable or unspecified: Secondary | ICD-10-CM

## 2018-04-21 DIAGNOSIS — Z3400 Encounter for supervision of normal first pregnancy, unspecified trimester: Secondary | ICD-10-CM

## 2018-04-21 DIAGNOSIS — Z3402 Encounter for supervision of normal first pregnancy, second trimester: Secondary | ICD-10-CM

## 2018-04-21 NOTE — Progress Notes (Signed)
   PRENATAL VISIT NOTE  Subjective:  Crystal Terry is a 27 y.o. G1P0000 at 1733w5d being seen today for ongoing prenatal care.  She is currently monitored for the following issues for this high-risk pregnancy and has Supervision of low-risk first pregnancy; Vanishing twin syndrome; Family history of fragile X syndrome; LGSIL on Pap smear of cervix; Cleft lip; and Cleft lip and palate, fetal, affecting care of mother, antepartum, single gestation on their problem list.  Patient reports no complaints.  Contractions: Not present. Vag. Bleeding: None.  Movement: Present. Denies leaking of fluid.   The following portions of the patient's history were reviewed and updated as appropriate: allergies, current medications, past family history, past medical history, past social history, past surgical history and problem list. Problem list updated.  Objective:   Vitals:   04/21/18 1634  Weight: 164 lb 1.6 oz (74.4 kg)    Fetal Status: Fetal Heart Rate (bpm): 145   Movement: Present     General:  Alert, oriented and cooperative. Patient is in no acute distress.  Skin: Skin is warm and dry. No rash noted.   Cardiovascular: Normal heart rate noted  Respiratory: Normal respiratory effort, no problems with respiration noted  Abdomen: Soft, gravid, appropriate for gestational age.  Pain/Pressure: Absent     Pelvic: Cervical exam deferred        Extremities: Normal range of motion.  Edema: None  Mental Status: Normal mood and affect. Normal behavior. Normal judgment and thought content.   Assessment and Plan:  Pregnancy: G1P0000 at 9833w5d  1. Encounter for supervision of low-risk first pregnancy, antepartum  - AFP, Serum, Open Spina Bifida  2. Family history of fragile X syndrome  3. LGSIL on Pap smear of cervix   4. Cleft lip and palate, fetal, affecting care of mother, antepartum, single gestation  Preterm labor symptoms and general obstetric precautions including but not limited to vaginal  bleeding, contractions, leaking of fluid and fetal movement were reviewed in detail with the patient. Please refer to After Visit Summary for other counseling recommendations.  No follow-ups on file.  Future Appointments  Date Time Provider Department Center  05/03/2018 11:00 AM [facility] US 3 [facility]    Allie BossierMyra C Nykole Matos, MD

## 2018-04-24 LAB — AFP, SERUM, OPEN SPINA BIFIDA
AFP MoM: 1.17
AFP VALUE AFPOSL: 78.8 ng/mL
GEST. AGE ON COLLECTION DATE: 21.9 wk
Maternal Age At EDD: 28 yr
OSBR RISK 1 IN: 7137
Test Results:: NEGATIVE
WEIGHT: 164 [lb_av]

## 2018-04-25 ENCOUNTER — Other Ambulatory Visit (HOSPITAL_COMMUNITY): Payer: Self-pay

## 2018-05-03 ENCOUNTER — Ambulatory Visit (HOSPITAL_COMMUNITY)
Admission: RE | Admit: 2018-05-03 | Discharge: 2018-05-03 | Disposition: A | Discharge status: Home / Self Care | Payer: 59 | Source: Ambulatory Visit | Attending: Obstetrics and Gynecology | Admitting: Obstetrics and Gynecology

## 2018-05-03 ENCOUNTER — Encounter (HOSPITAL_COMMUNITY): Payer: Self-pay

## 2018-05-03 ENCOUNTER — Other Ambulatory Visit (HOSPITAL_COMMUNITY): Payer: Self-pay | Admitting: *Deleted

## 2018-05-03 ENCOUNTER — Other Ambulatory Visit (HOSPITAL_COMMUNITY): Payer: Self-pay | Admitting: Obstetrics and Gynecology

## 2018-05-03 DIAGNOSIS — Z3A23 23 weeks gestation of pregnancy: Secondary | ICD-10-CM

## 2018-05-03 DIAGNOSIS — O358XX0 Maternal care for other (suspected) fetal abnormality and damage, not applicable or unspecified: Secondary | ICD-10-CM

## 2018-05-03 DIAGNOSIS — Z362 Encounter for other antenatal screening follow-up: Secondary | ICD-10-CM | POA: Diagnosis not present

## 2018-05-03 DIAGNOSIS — O35AXX0 Maternal care for other (suspected) fetal abnormality and damage, fetal facial anomalies, not applicable or unspecified: Secondary | ICD-10-CM

## 2018-05-19 ENCOUNTER — Ambulatory Visit (INDEPENDENT_AMBULATORY_CARE_PROVIDER_SITE_OTHER): Payer: 59 | Admitting: Obstetrics & Gynecology

## 2018-05-19 VITALS — BP 127/78 | HR 87 | Wt 173.3 lb

## 2018-05-19 DIAGNOSIS — B86 Scabies: Secondary | ICD-10-CM | POA: Insufficient documentation

## 2018-05-19 DIAGNOSIS — Z3402 Encounter for supervision of normal first pregnancy, second trimester: Secondary | ICD-10-CM | POA: Diagnosis not present

## 2018-05-19 DIAGNOSIS — Z3400 Encounter for supervision of normal first pregnancy, unspecified trimester: Secondary | ICD-10-CM

## 2018-05-19 MED ORDER — PERMETHRIN 5 % EX CREA: 1.0000 "application " | TOPICAL_CREAM | Freq: Once | CUTANEOUS | 0 refills | Status: AC

## 2018-05-19 NOTE — Patient Instructions (Signed)
Second Trimester of Pregnancy  Scabies, Adult Scabies is a skin condition that happens when very small insects get under the skin (infestation). This causes a rash and severe itchiness. Scabies can spread from person to person (is contagious). If you get scabies, it is common for others in your household to get scabies too. With proper treatment, symptoms usually go away in 2-4 weeks. Scabies usually does not cause lasting problems. What are the causes? This condition is caused by mites (Sarcoptes scabiei, or human itch mites) that can only be seen with a microscope. The mites get into the top layer of skin and lay eggs. Scabies can spread from person to person through:  Close contact with a person who has scabies.  Contact with infested items, such as towels, bedding, or clothing.  What increases the risk? This condition is more likely to develop in:  People who live in nursing homes and other extended-care facilities.  People who have sexual contact with a partner who has scabies.  Young children who attend child care facilities.  People who care for others who are at increased risk for scabies.  What are the signs or symptoms? Symptoms of this condition may include:  Severe itchiness. This is often worse at night.  A rash that includes tiny red bumps or blisters. The rash commonly occurs on the wrist, elbow, armpit, fingers, waist, groin, or buttocks. Bumps may form a line (burrow) in some areas.  Skin irritation. This can include scaly patches or sores.  How is this diagnosed? This condition is diagnosed with a physical exam. Your health care provider will look closely at your skin. In some cases, your health care provider may take a sample of your affected skin (skin scraping) and have it examined under a microscope. How is this treated? This condition may be treated with:  Medicated cream or lotion that kills the mites. This is spread on the entire body and left on for  several hours. Usually, one treatment with medicated cream or lotion is enough to kill all of the mites. In severe cases, the treatment may be repeated.  Medicated cream that relieves itching.  Medicines that help to relieve itching.  Medicines that kill the mites. This treatment is rarely used.  Follow these instructions at home:  Medicines  Take or apply over-the-counter and prescription medicines as told by your health care provider.  Apply medicated cream or lotion as told by your health care provider.  Do not wash off the medicated cream or lotion until the necessary amount of time has passed. Skin Care  Avoid scratching your affected skin.  Keep your fingernails closely trimmed to reduce injury from scratching.  Take cool baths or apply cool washcloths to help reduce itching. General instructions  Clean all items that you recently had contact with, including bedding, clothing, and furniture. Do this on the same day that your treatment starts. ? Use hot water when you wash items. ? Place unwashable items into closed, airtight plastic bags for at least 3 days. The mites cannot live for more than 3 days away from human skin. ? Vacuum furniture and mattresses that you use.  Make sure that other people who may have been infested are examined by a health care provider. These include members of your household and anyone who may have had contact with infested items.  Keep all follow-up visits as told by your health care provider. This is important. Contact a health care provider if:  You have itching  that does not go away after 4 weeks of treatment.  You continue to develop new bumps or burrows.  You have redness, swelling, or pain in your rash area after treatment.  You have fluid, blood, or pus coming from your rash. This information is not intended to replace advice given to you by your health care provider. Make sure you discuss any questions you have with your health  care provider. Document Released: 05/15/2015 Document Revised: 01/30/2016 Document Reviewed: 03/26/2015 Elsevier Interactive Patient Education  [employer]2018 [employer]. The second trimester is from week 13 through week 28, month 4 through 6. This is often the time in pregnancy that you feel your best. Often times, morning sickness has lessened or quit. You may have more energy, and you may get hungry more often. Your unborn baby (fetus) is growing rapidly. At the end of the sixth month, he or she is about 9 inches long and weighs about 1 pounds. You will likely feel the baby move (quickening) between 18 and 20 weeks of pregnancy. Follow these instructions at home:  Avoid all smoking, herbs, and alcohol. Avoid drugs not approved by your doctor.  Do not use any tobacco products, including cigarettes, chewing tobacco, and electronic cigarettes. If you need help quitting, ask your doctor. You may get counseling or other support to help you quit.  Only take medicine as told by your doctor. Some medicines are safe and some are not during pregnancy.  Exercise only as told by your doctor. Stop exercising if you start having cramps.  Eat regular, healthy meals.  Wear a good support bra if your breasts are tender.  Do not use hot tubs, steam rooms, or saunas.  Wear your seat belt when driving.  Avoid raw meat, uncooked cheese, and liter boxes and soil used by cats.  Take your prenatal vitamins.  Take 1500-2000 milligrams of calcium daily starting at the 20th week of pregnancy until you deliver your baby.  Try taking medicine that helps you poop (stool softener) as needed, and if your doctor approves. Eat more fiber by eating fresh fruit, vegetables, and whole grains. Drink enough fluids to keep your pee (urine) clear or pale yellow.  Take warm water baths (sitz baths) to soothe pain or discomfort caused by hemorrhoids. Use hemorrhoid cream if your doctor approves.  If you have puffy, bulging veins  (varicose veins), wear support hose. Raise (elevate) your feet for 15 minutes, 3-4 times a day. Limit salt in your diet.  Avoid heavy lifting, wear low heals, and sit up straight.  Rest with your legs raised if you have leg cramps or low back pain.  Visit your dentist if you have not gone during your pregnancy. Use a soft toothbrush to brush your teeth. Be gentle when you floss.  You can have sex (intercourse) unless your doctor tells you not to.  Go to your doctor visits. Get help if:  You feel dizzy.  You have mild cramps or pressure in your lower belly (abdomen).  You have a nagging pain in your belly area.  You continue to feel sick to your stomach (nauseous), throw up (vomit), or have watery poop (diarrhea).  You have bad smelling fluid coming from your vagina.  You have pain with peeing (urination). Get help right away if:  You have a fever.  You are leaking fluid from your vagina.  You have spotting or bleeding from your vagina.  You have severe belly cramping or pain.  You lose or gain  weight rapidly.  You have trouble catching your breath and have chest pain.  You notice sudden or extreme puffiness (swelling) of your face, hands, ankles, feet, or legs.  You have not felt the baby move in over an hour.  You have severe headaches that do not go away with medicine.  You have vision changes. This information is not intended to replace advice given to you by your health care provider. Make sure you discuss any questions you have with your health care provider. Document Released: 11/18/2009 Document Revised: 01/30/2016 Document Reviewed: 10/25/2012 Elsevier Interactive Patient Education  2017 [employer].

## 2018-05-19 NOTE — Progress Notes (Signed)
   PRENATAL VISIT NOTE  Subjective:  Crystal Terry is a 27 y.o. G1P0000 at 1864w5d being seen today for ongoing prenatal care.  She is currently monitored for the following issues for this low-risk pregnancy and has Supervision of low-risk first pregnancy; Vanishing twin syndrome; Family history of fragile X syndrome; LGSIL on Pap smear of cervix; Cleft lip; Cleft lip and palate, fetal, affecting care of mother, antepartum, single gestation; and Scabies on their problem list.  Patient reports scabies.  Contractions: Not present. Vag. Bleeding: None.  Movement: Present. Denies leaking of fluid.   The following portions of the patient's history were reviewed and updated as appropriate: allergies, current medications, past family history, past medical history, past social history, past surgical history and problem list. Problem list updated.  Objective:   Vitals:   05/19/18 1633  BP: 127/78  Pulse: 87  Weight: 173 lb 4.8 oz (78.6 kg)    Fetal Status: Fetal Heart Rate (bpm): 157   Movement: Present     General:  Alert, oriented and cooperative. Patient is in no acute distress.  Skin: Skin is warm and dry. No rash noted.   Cardiovascular: Normal heart rate noted  Respiratory: Normal respiratory effort, no problems with respiration noted  Abdomen: Soft, gravid, appropriate for gestational age.  Pain/Pressure: Absent     Pelvic: Cervical exam deferred        Extremities: Normal range of motion.  Edema: None  Mental Status: Normal mood and affect. Normal behavior. Normal judgment and thought content.   Assessment and Plan:  Pregnancy: G1P0000 at 4764w5d  1. Encounter for supervision of low-risk first pregnancy, antepartum scabies - permethrin (ELIMITE) 5 % cream; Apply 1 application topically once for 1 dose.  Dispense: 60 g; Refill: 0  2. Scabies treated  Preterm labor symptoms and general obstetric precautions including but not limited to vaginal bleeding, contractions, leaking of  fluid and fetal movement were reviewed in detail with the patient. Please refer to After Visit Summary for other counseling recommendations.  Return in about 2 weeks (around 06/02/2018) for ob fu needs early am for fasting 2hr gtt/28wk labs.  Future Appointments  Date Time Provider Department Center  05/31/2018 10:30 AM [facility] US 1 [facility]    Scheryl DarterJames Kendi Defalco, MD

## 2018-05-19 NOTE — Progress Notes (Signed)
States she may have scabies. PMH completed

## 2018-05-31 ENCOUNTER — Encounter (HOSPITAL_COMMUNITY): Payer: Self-pay

## 2018-05-31 ENCOUNTER — Other Ambulatory Visit: Payer: Self-pay | Admitting: General Practice

## 2018-05-31 ENCOUNTER — Ambulatory Visit (HOSPITAL_COMMUNITY)
Admission: RE | Admit: 2018-05-31 | Discharge: 2018-05-31 | Disposition: A | Discharge status: Home / Self Care | Payer: 59 | Source: Ambulatory Visit | Attending: Obstetrics & Gynecology | Admitting: Obstetrics & Gynecology

## 2018-05-31 DIAGNOSIS — Z3A27 27 weeks gestation of pregnancy: Secondary | ICD-10-CM | POA: Insufficient documentation

## 2018-05-31 DIAGNOSIS — Z3403 Encounter for supervision of normal first pregnancy, third trimester: Secondary | ICD-10-CM

## 2018-05-31 DIAGNOSIS — O35AXX0 Maternal care for other (suspected) fetal abnormality and damage, fetal facial anomalies, not applicable or unspecified: Secondary | ICD-10-CM

## 2018-05-31 DIAGNOSIS — Z362 Encounter for other antenatal screening follow-up: Secondary | ICD-10-CM | POA: Insufficient documentation

## 2018-05-31 DIAGNOSIS — O358XX0 Maternal care for other (suspected) fetal abnormality and damage, not applicable or unspecified: Secondary | ICD-10-CM | POA: Diagnosis not present

## 2018-06-01 ENCOUNTER — Ambulatory Visit (INDEPENDENT_AMBULATORY_CARE_PROVIDER_SITE_OTHER): Payer: 59 | Admitting: Obstetrics and Gynecology

## 2018-06-01 ENCOUNTER — Other Ambulatory Visit: Payer: 59

## 2018-06-01 VITALS — BP 109/68 | HR 87 | Wt 172.7 lb

## 2018-06-01 DIAGNOSIS — Z23 Encounter for immunization: Secondary | ICD-10-CM

## 2018-06-01 DIAGNOSIS — Z3403 Encounter for supervision of normal first pregnancy, third trimester: Secondary | ICD-10-CM

## 2018-06-01 DIAGNOSIS — O35AXX0 Maternal care for other (suspected) fetal abnormality and damage, fetal facial anomalies, not applicable or unspecified: Secondary | ICD-10-CM

## 2018-06-01 DIAGNOSIS — O358XX0 Maternal care for other (suspected) fetal abnormality and damage, not applicable or unspecified: Secondary | ICD-10-CM

## 2018-06-01 NOTE — Progress Notes (Signed)
   PRENATAL VISIT NOTE  Subjective:  Crystal Terry is a 27 y.o. G1P0000 at [redacted]w[redacted]d being seen today for ongoing prenatal care.  She is currently monitored for the following issues for this low-risk pregnancy and has Supervision of low-risk first pregnancy; Vanishing twin syndrome; Family history of fragile X syndrome; LGSIL on Pap smear of cervix; Cleft lip; Cleft lip and palate, fetal, affecting care of mother, antepartum, single gestation; and Scabies on their problem list.  Patient reports no complaints.  Contractions: Not present. Vag. Bleeding: None.  Movement: Present. Denies leaking of fluid.   The following portions of the patient's history were reviewed and updated as appropriate: allergies, current medications, past family history, past medical history, past social history, past surgical history and problem list. Problem list updated.  Objective:   Vitals:   06/01/18 0904  BP: 109/68  Pulse: 87  Weight: 172 lb 11.2 oz (78.3 kg)    Fetal Status: Fetal Heart Rate (bpm): 150 Fundal Height: 28 cm Movement: Present     General:  Alert, oriented and cooperative. Patient is in no acute distress.  Skin: Skin is warm and dry. No rash noted.   Cardiovascular: Normal heart rate noted  Respiratory: Normal respiratory effort, no problems with respiration noted  Abdomen: Soft, gravid, appropriate for gestational age.  Pain/Pressure: Absent     Pelvic: Cervical exam deferred        Extremities: Normal range of motion.  Edema: None  Mental Status: Normal mood and affect. Normal behavior. Normal judgment and thought content.   Assessment and Plan:  Pregnancy: G1P0000 at [redacted]w[redacted]d  1. Encounter for supervision of low-risk first pregnancy in third trimester - Anticipatory guidance for OB visits being every 2 wks starting today - Tdap vaccine greater than or equal to 7yo IM  Preterm labor symptoms and general obstetric precautions including but not limited to vaginal bleeding, contractions,  leaking of fluid and fetal movement were reviewed in detail with the patient. Please refer to After Visit Summary for other counseling recommendations.  Return in about 2 weeks (around 06/15/2018) for Return OB visit.  Future Appointments  Date Time Provider Department Center  06/01/2018  9:30 AM [facility]  06/28/2018  9:30 AM [facility] [place] 5 [facility]    Raelyn Mora, CNM

## 2018-06-01 NOTE — Patient Instructions (Signed)

## 2018-06-02 LAB — RPR: RPR: NONREACTIVE

## 2018-06-02 LAB — GLUCOSE TOLERANCE, 2 HOURS W/ 1HR
GLUCOSE, FASTING: 69 mg/dL (ref 65–91)
Glucose, 1 hour: 69 mg/dL (ref 65–179)
Glucose, 2 hour: 84 mg/dL (ref 65–152)

## 2018-06-02 LAB — CBC
Hematocrit: 33.3 % — ABNORMAL LOW (ref 34.0–46.6)
Hemoglobin: 11 g/dL — ABNORMAL LOW (ref 11.1–15.9)
MCH: 31.7 pg (ref 26.6–33.0)
MCHC: 33 g/dL (ref 31.5–35.7)
MCV: 96 fL (ref 79–97)
PLATELETS: 204 10*3/uL (ref 150–450)
RBC: 3.47 x10E6/uL — ABNORMAL LOW (ref 3.77–5.28)
RDW: 11.9 % — AB (ref 12.3–15.4)
WBC: 12.2 10*3/uL — AB (ref 3.4–10.8)

## 2018-06-02 LAB — HIV ANTIBODY (ROUTINE TESTING W REFLEX): HIV Screen 4th Generation wRfx: NONREACTIVE

## 2018-06-06 ENCOUNTER — Other Ambulatory Visit: Payer: Self-pay

## 2018-06-07 MED ORDER — ZOLPIDEM TARTRATE 5 MG PO TABS: 5.0000 mg | ORAL_TABLET | Freq: Every evening | ORAL | 0 refills | Status: AC | PRN

## 2018-06-13 ENCOUNTER — Other Ambulatory Visit (HOSPITAL_COMMUNITY): Payer: Self-pay | Admitting: *Deleted

## 2018-06-13 DIAGNOSIS — O359XX0 Maternal care for (suspected) fetal abnormality and damage, unspecified, not applicable or unspecified: Secondary | ICD-10-CM

## 2018-06-20 ENCOUNTER — Ambulatory Visit (INDEPENDENT_AMBULATORY_CARE_PROVIDER_SITE_OTHER): Payer: 59 | Admitting: Advanced Practice Midwife

## 2018-06-20 ENCOUNTER — Encounter: Payer: Self-pay | Admitting: Advanced Practice Midwife

## 2018-06-20 DIAGNOSIS — Z3403 Encounter for supervision of normal first pregnancy, third trimester: Secondary | ICD-10-CM

## 2018-06-20 NOTE — Progress Notes (Signed)
   PRENATAL VISIT NOTE  Subjective:  Crystal Terry is a 27 y.o. G1P0000 at [redacted]w[redacted]d being seen today for ongoing prenatal care.  She is currently monitored for the following issues for this low-risk pregnancy and has Supervision of low-risk first pregnancy; Vanishing twin syndrome; Family history of fragile X syndrome; LGSIL on Pap smear of cervix; Cleft lip; Cleft lip and palate, fetal, affecting care of mother, antepartum, single gestation; and Scabies on their problem list.  Patient reports no complaints.  Contractions: Irritability. Vag. Bleeding: None.  Movement: Present. Denies leaking of fluid.   The following portions of the patient's history were reviewed and updated as appropriate: allergies, current medications, past family history, past medical history, past social history, past surgical history and problem list. Problem list updated.  Objective:   Vitals:   06/20/18 0949  BP: 124/86  Pulse: 93  Weight: 177 lb 11.2 oz (80.6 kg)    Fetal Status: Fetal Heart Rate (bpm): 140 Fundal Height: 30 cm Movement: Present     General:  Alert, oriented and cooperative. Patient is in no acute distress.  Skin: Skin is warm and dry. No rash noted.   Cardiovascular: Normal heart rate noted  Respiratory: Normal respiratory effort, no problems with respiration noted  Abdomen: Soft, gravid, appropriate for gestational age.  Pain/Pressure: Present     Pelvic: Cervical exam deferred        Extremities: Normal range of motion.  Edema: None  Mental Status: Normal mood and affect. Normal behavior. Normal judgment and thought content.   Assessment and Plan:  Pregnancy: G1P0000 at [redacted]w[redacted]d  1. Encounter for supervision of low-risk first pregnancy in third trimester - Routine care  Preterm labor symptoms and general obstetric precautions including but not limited to vaginal bleeding, contractions, leaking of fluid and fetal movement were reviewed in detail with the patient. Please refer to After  Visit Summary for other counseling recommendations.  Return in about 2 weeks (around 07/04/2018).  Future Appointments  Date Time Provider Department Center  06/28/2018  9:30 AM [facility] [place] 5 [facility]    Thressa Sheller, CNM

## 2018-06-20 NOTE — Patient Instructions (Addendum)
AREA PEDIATRIC/FAMILY [place] [address] [place], [place]  [address] Phone - [phone]   Fax - [phone]  [facility] [address] [place], [place] [address] Phone - [phone]   Fax - [place] [address] [place], [place]  [address] Phone - [phone]   Fax - [phone]  [place]. [address] [place] [address] [place], [place]  [address] Phone - [phone]   Fax - [phone]  [place] [address] [place], [place]  [address] Phone - [phone]   Fax - [phone]  [facility] [address], [address] [place], [place]  [address] Phone - [phone]   Fax - [place] [address] [place], [place]  [address] Phone - [phone]   Fax - [phone]  [place] [address] [place], [place] [address] [place], [place]  [address] Phone - [phone]   Fax - [place] [address] [place], [place]  [address] Phone - [phone]   Fax - [phone] [facility] [place]. [address] [place], [place]  [address] Phone - [phone]   Fax - [phone]  [facility] [place] [address] [place], [place]  [address] Phone - [phone]   Fax - [phone]  [facility] AT [place], [place], [place]  [address] Phone - [phone]   Fax - [place] [address], [place], [place]  [address] Phone - [phone]   Fax - [phone]  [facility] [address], [place], [place]  [address] Phone - [place], [place]  [address] Phone - [phone]   Fax - [place] [address] [place], [place]  [address] Phone - [phone]   Fax - [phone]  [place] [address] [place], [place]  [address] Phone - [phone]   Fax - [place]. [place], [place]  [address] Phone - [phone]   Fax - [place], [place], [place]  [address] Phone - [phone]   Fax - [place] [address] [place], [place]  [address] Phone - [phone]   Fax - [phone]  [facility] RUBIN [address] [place], [place]  [address] Phone - [phone]   Fax - [place], [place], [place]  [address] Phone - [phone]   Fax - [phone]  [place] [address] [place], [place]  [address] Phone - [phone]   Fax - [phone] [place] [place] [address] [place], [place]  [address] Phone - [phone]   Fax - [place] [address] [place], [place]  [address] Phone - [phone]   Fax - [place] [address], [place], [place]  [address] Phone - [phone]   Fax - [phone]  Kerrick MD [address] [place] [address] Phone [phone]  Fax [phone]   Contraception Choices Contraception, also called birth control, refers to methods or devices that prevent  pregnancy. Hormonal methods Contraceptive implant A contraceptive implant is a thin, plastic tube that contains a hormone. It is inserted into the upper part of the arm. It can remain in place for up to 3 years. Progestin-only injections Progestin-only injections are injections of progestin, a synthetic form of the hormone progesterone. They are given every 3 months by a health care  provider. Birth control pills Birth control pills are pills that contain hormones that prevent pregnancy. They must be taken once a day, preferably at the same time each day. Birth control patch The birth control patch contains hormones that prevent pregnancy. It is placed on the skin and must be changed once a week for three weeks and removed on the fourth week. A prescription is needed to use this method of contraception. Vaginal ring A vaginal ring contains hormones that prevent pregnancy. It is placed in the vagina for three weeks and removed on the fourth week. After that, the process is repeated with a new ring. A prescription is needed to use this method of contraception. Emergency contraceptive Emergency contraceptives prevent pregnancy after unprotected sex. They come in pill form and can be taken up to 5 days after sex. They work best the sooner they are taken after having sex. Most emergency contraceptives are available without a prescription. This method should not be used as your only form of birth control. Barrier methods Female condom A female condom is a thin sheath that is worn over the penis during sex. Condoms keep sperm from going inside a woman's body. They can be used with a spermicide to increase their effectiveness. They should be disposed after a single use. Female condom A female condom is a soft, loose-fitting sheath that is put into the vagina before sex. The condom keeps sperm from going inside a woman's body. They should be disposed after a single use. Diaphragm A diaphragm is a soft, dome-shaped barrier. It is inserted into the vagina before sex, along with a spermicide. The diaphragm blocks sperm from entering the uterus, and the spermicide kills sperm. A diaphragm should be left in the vagina for 6-8 hours after sex and removed within 24 hours. A diaphragm is prescribed and fitted by a health care provider. A diaphragm should be replaced every 1-2 years, after giving  birth, after gaining more than 15 lb (6.8 kg), and after pelvic surgery. Cervical cap A cervical cap is a round, soft latex or plastic cup that fits over the cervix. It is inserted into the vagina before sex, along with spermicide. It blocks sperm from entering the uterus. The cap should be left in place for 6-8 hours after sex and removed within 48 hours. A cervical cap must be prescribed and fitted by a health care provider. It should be replaced every 2 years. Sponge A sponge is a soft, circular piece of polyurethane foam with spermicide on it. The sponge helps block sperm from entering the uterus, and the spermicide kills sperm. To use it, you make it wet and then insert it into the vagina. It should be inserted before sex, left in for at least 6 hours after sex, and removed and thrown away within 30 hours. Spermicides Spermicides are chemicals that kill or block sperm from entering the cervix and uterus. They can come as a cream, jelly, suppository, foam, or tablet. A spermicide should be inserted into the vagina with an applicator at least 24-58 minutes before sex to allow time for it to work. The process must be repeated  every time you have sex. Spermicides do not require a prescription. Intrauterine contraception Intrauterine device (IUD) An IUD is a T-shaped device that is put in a woman's uterus. There are two types:  Hormone IUD.This type contains progestin, a synthetic form of the hormone progesterone. This type can stay in place for 3-5 years.  Copper IUD.This type is wrapped in copper wire. It can stay in place for 10 years.  Permanent methods of contraception Female tubal ligation In this method, a woman's fallopian tubes are sealed, tied, or blocked during surgery to prevent eggs from traveling to the uterus. Hysteroscopic sterilization In this method, a small, flexible insert is placed into each fallopian tube. The inserts cause scar tissue to form in the fallopian tubes and block  them, so sperm cannot reach an egg. The procedure takes about 3 months to be effective. Another form of birth control must be used during those 3 months. Female sterilization This is a procedure to tie off the tubes that carry sperm (vasectomy). After the procedure, the man can still ejaculate fluid (semen). Natural planning methods Natural family planning In this method, a couple does not have sex on days when the woman could become pregnant. Calendar method This means keeping track of the length of each menstrual cycle, identifying the days when pregnancy can happen, and not having sex on those days. Ovulation method In this method, a couple avoids sex during ovulation. Symptothermal method This method involves not having sex during ovulation. The woman typically checks for ovulation by watching changes in her temperature and in the consistency of cervical mucus. Post-ovulation method In this method, a couple waits to have sex until after ovulation. Summary  Contraception, also called birth control, means methods or devices that prevent pregnancy.  Hormonal methods of contraception include implants, injections, pills, patches, vaginal rings, and emergency contraceptives.  Barrier methods of contraception can include female condoms, female condoms, diaphragms, cervical caps, sponges, and spermicides.  There are two types of IUDs (intrauterine devices). An IUD can be put in a woman's uterus to prevent pregnancy for 3-5 years.  Permanent sterilization can be done through a procedure for males, females, or both.  Natural family planning methods involve not having sex on days when the woman could become pregnant. This information is not intended to replace advice given to you by your health care provider. Make sure you discuss any questions you have with your health care provider. Document Released: 08/24/2005 Document Revised: 09/26/2016 Document Reviewed: 09/26/2016 Elsevier Interactive  Patient Education  2018 [place] Education Options: [facility] Department Classes:  Childbirth education classes can help you get ready for a positive parenting experience. You can also meet other expectant parents and get free stuff for your baby. Each class runs for five weeks on the same night and costs $45 for the mother-to-be and her support person. Medicaid covers the cost if you are eligible. Call [phone] to register. [facility] Childbirth Education:  [phone] or [phone] or [email or web address]  Baby & Me Class: Discuss newborn & infant parenting and family adjustment issues with other new mothers in a relaxed environment. Each week brings a new speaker or baby-centered activity. We encourage new mothers to join [place] every Thursday at 11:00am. Babies birth until crawling. No registration or fee. Daddy [employer]: This course offers Dads-to-be the tools and knowledge needed to feel confident on their journey to becoming new fathers. Experienced dads, who have been trained as coaches, teach dads-to-be how to hold,  comfort, diaper, swaddle and play with their infant while being able to support the new mom as well. A class for men taught by men. $25/dad Big Brother/Big Sister: Let your children share in the joy of a new brother or sister in this special class designed just for them. Class includes discussion about how families care for babies: swaddling, holding, diapering, safety as well as how they can be helpful in their new role. This class is designed for children ages 35 to 51, but any age is welcome. Please register each child individually. $5/child  Mom Talk: This mom-led group offers support and connection to mothers as they journey through the adjustments and struggles of that sometimes overwhelming first year after the birth of a child. Tuesdays at 10:00am and Thursdays at 6:00pm. Babies welcome. No registration or fee. Breastfeeding Support  Group: This group is a mother-to-mother support circle where moms have the opportunity to share their breastfeeding experiences. A Lactation Consultant is present for questions and concerns. Meets each Tuesday at 11:00am. No fee or registration. Breastfeeding Your Baby: Learn what to expect in the first days of breastfeeding your newborn.  This class will help you feel more confident with the skills needed to begin your breastfeeding experience. Many new mothers are concerned about breastfeeding after leaving the hospital. This class will also address the most common fears and challenges about breastfeeding during the first few weeks, months and beyond. (call for fee) Comfort Techniques and Tour: This 2 hour interactive class will provide you the opportunity to learn & practice hands-on techniques that can help relieve some of the discomfort of labor and encourage your baby to rotate toward the best position for birth. You and your partner will be able to try a variety of labor positions with birth balls and rebozos as well as practice breathing, relaxation, and visualization techniques. A tour of the [facility] is included with this class. $20 per registrant and support person Childbirth Class- Weekend Option: This class is a Weekend version of our Birth & Baby series. It is designed for parents who have a difficult time fitting several weeks of classes into their schedule. It covers the care of your newborn and the basics of labor and childbirth. It also includes a [facility] and lunch. The class is held two consecutive days: beginning on Friday evening from 6:30 - 8:30 p.m. and the next day, Saturday from 9 a.m. - 4 p.m. (call for fee) Doren Custard Class: Interested in a waterbirth?  This informational class will help you discover whether waterbirth is the right fit for you. Education about waterbirth itself, supplies you would need and how to  assemble your support team is what you can expect from this class. Some obstetrical practices require this class in order to pursue a waterbirth. (Not all obstetrical practices offer waterbirth-check with your healthcare provider.) Register only the expectant mom, but you are encouraged to bring your partner to class! Required if planning waterbirth, no fee. Infant/Child CPR: Parents, grandparents, babysitters, and friends learn Cardio-Pulmonary Resuscitation skills for infants and children. You will also learn how to treat both conscious and unconscious choking in infants and children. This Family & Friends program does not offer certification. Register each participant individually to ensure that enough mannequins are available. (Call for fee) Grandparent Love: Expecting a grandbaby? This class is for you! Learn about the latest infant care and safety recommendations and ways to support your own child as he  or she transitions into the parenting role. Taught by Registered Nurses who are childbirth instructors, but most importantly...they are grandmothers too! $10/person. Childbirth Class- Natural Childbirth: This series of 5 weekly classes is for expectant parents who want to learn and practice natural methods of coping with the process of labor and childbirth. Relaxation, breathing, massage, visualization, role of the partner, and helpful positioning are highlighted. Participants learn how to be confident in their body's ability to give birth. This class will empower and help parents make informed decisions about their own care. Includes discussion that will help new parents transition into the immediate postpartum period. [facility] is included. We suggest taking this class between 25-32 weeks, but it's only a recommendation. $75 per registrant and one support person or $30 Medicaid. Childbirth Class- 3 week Series: This option of 3 weekly classes helps you and your labor  partner prepare for childbirth. Newborn care, labor & birth, cesarean birth, pain management, and comfort techniques are discussed and a [place] [facility] is included. The class meets at the same time, on the same day of the week for 3 consecutive weeks beginning with the starting date you choose. $60 for registrant and one support person.  Marvelous Multiples: Expecting twins, triplets, or more? This class covers the differences in labor, birth, parenting, and breastfeeding issues that face multiples' parents. NICU tour is included. Led by a Certified Childbirth Educator who is the mother of twins. No fee. Caring for Baby: This class is for expectant and adoptive parents who want to learn and practice the most up-to-date newborn care for their babies. Focus is on birth through the first six weeks of life. Topics include feeding, bathing, diapering, crying, umbilical cord care, circumcision care and safe sleep. Parents learn to recognize symptoms of illness and when to call the pediatrician. Register only the mom-to-be and your partner or support person can plan to come with you! $10 per registrant and support person Childbirth Class- online option: This online class offers you the freedom to complete a Birth and Baby series in the comfort of your own home. The flexibility of this option allows you to review sections at your own pace, at times convenient to you and your support people. It includes additional video information, animations, quizzes, and extended activities. Get organized with helpful eClass tools, checklists, and trackers. Once you register online for the class, you will receive an email within a few days to accept the invitation and begin the class when the time is right for you. The content will be available to you for 60 days. $60 for 60 days of online access for you and your support people.  Local Doulas: Natural Baby  Doulas [email or web address] Tel: [phone] [email or web address] [employer] [phone] [email or web address] The Labor Hassell Halim  (also do waterbirth tub rental) [phone] [email or web address] Triad Birth Doula [phone] [email or web address].fi Sacred Rhythms  [phone] [email or web address]-[email or web address]/ [employer] (PADA) [email or web address]/ [place] Birth and Baby  [email or web address]/ Considering Waterbirth? Guide for patients at [facility] [employer]  Why consider waterbirth?  . Gentle birth for babies . Less pain medicine used in labor . May allow for passive descent/less pushing . May reduce perineal tears  . More mobility and instinctive maternal position changes . Increased maternal relaxation . Reduced blood pressure in labor  Is waterbirth safe? What are the risks of infection, drowning or other complications?  . Infection: o Very low risk (3.7 % for tub  vs 4.8% for bed) o 7 in 8000 waterbirths with documented infection o Poorly cleaned equipment most common cause o Slightly lower group B strep transmission rate  . Drowning o Maternal:  - Very low risk   - Related to seizures or fainting o Newborn:  - Very low risk. No evidence of increased risk of respiratory problems in multiple large studies - Physiological protection from breathing under water - Avoid underwater birth if there are any fetal complications - Once baby's head is out of the water, keep it out.  . Birth complication o Some reports of cord trauma, but risk decreased by bringing baby to surface gradually o No evidence of increased risk of shoulder dystocia. Mothers can usually change positions faster in water than in a bed, possibly aiding the maneuvers to free the shoulder.   You  must attend a Doren Custard class at [facility]  3rd Wednesday of every month from 7-9pm  [employer] by calling [phone] or online at [email or web address]  Bring [place] the certificate from the class to your prenatal appointment  Meet with a midwife at 36 weeks to see if you can still plan a waterbirth and to sign the consent.   Purchase or rent the following supplies:   Water Birth Pool (Birth Pool in a Box or [place] for instance)  (Tubs start ~$125)  Single-use disposable tub liner designed for your brand of tub  New garden hose labeled "lead-free", "suitable for drinking water",  Electric drain pump to remove water (We recommend 792 gallon per hour or greater pump.)   Separate garden hose to remove the dirty water  Fish net  Bathing suit top (optional)  Long-handled mirror (optional)  Places to purchase or rent supplies  GotWebTools.is for tub purchases and supplies  Waterbirthsolutions.com for tub purchases and supplies  The Labor Ladies (www.[email or web address]) $275 for tub rental/set-up & take down/kit   [employer] (http://www.fleming.com/.htm) Information regarding doulas (labor support) who provide pool rentals  Our practice has a Birth Pool in a Box tub at the hospital that you may borrow on a first-come-first-served basis. It is your responsibility to to set up, clean and break down the tub. We cannot guarantee the availability of this tub in advance. You are responsible for bringing all accessories listed above. If you do not have all necessary supplies you cannot have a waterbirth.    Things that would prevent you from having a waterbirth:  Premature, <37wks  Previous cesarean birth  Presence of thick meconium-stained fluid  Multiple gestation (Twins, triplets, etc.)  Uncontrolled diabetes or gestational diabetes requiring medication  Hypertension requiring medication or diagnosis of pre-eclampsia  Heavy  vaginal bleeding  Non-reassuring fetal heart rate  Active infection (MRSA, etc.). Group B Strep is NOT a contraindication for  waterbirth.  If your labor has to be induced and induction method requires continuous  monitoring of the baby's heart rate  Other risks/issues identified by your obstetrical provider  Please remember that birth is unpredictable. Under certain unforeseeable circumstances your provider may advise against giving birth in the tub. These decisions will be made on a case-by-case basis and with the safety of you and your baby as our highest priority.      Barbee Shro[place], [place], [place] [address]  [phone]  Appt is on 11/12, unsure of time

## 2018-06-28 ENCOUNTER — Ambulatory Visit (HOSPITAL_COMMUNITY)
Admission: RE | Admit: 2018-06-28 | Discharge: 2018-06-28 | Disposition: A | Discharge status: Home / Self Care | Payer: 59 | Source: Ambulatory Visit | Attending: Obstetrics & Gynecology | Admitting: Obstetrics & Gynecology

## 2018-06-28 ENCOUNTER — Encounter (HOSPITAL_COMMUNITY): Payer: Self-pay

## 2018-06-28 ENCOUNTER — Other Ambulatory Visit (HOSPITAL_COMMUNITY): Payer: Self-pay | Admitting: *Deleted

## 2018-06-28 DIAGNOSIS — Q369 Cleft lip, unilateral: Secondary | ICD-10-CM

## 2018-06-28 DIAGNOSIS — O359XX0 Maternal care for (suspected) fetal abnormality and damage, unspecified, not applicable or unspecified: Secondary | ICD-10-CM

## 2018-06-28 DIAGNOSIS — Z3A31 31 weeks gestation of pregnancy: Secondary | ICD-10-CM | POA: Diagnosis not present

## 2018-06-28 DIAGNOSIS — Z362 Encounter for other antenatal screening follow-up: Secondary | ICD-10-CM

## 2018-06-28 DIAGNOSIS — O358XX0 Maternal care for other (suspected) fetal abnormality and damage, not applicable or unspecified: Secondary | ICD-10-CM

## 2018-07-04 ENCOUNTER — Other Ambulatory Visit: Payer: Self-pay

## 2018-07-04 ENCOUNTER — Inpatient Hospital Stay (HOSPITAL_COMMUNITY)
Admission: AD | Admit: 2018-07-04 | Discharge: 2018-07-04 | Disposition: A | Discharge status: Home / Self Care | Payer: 59 | Source: Ambulatory Visit | Attending: Obstetrics and Gynecology | Admitting: Obstetrics and Gynecology

## 2018-07-04 ENCOUNTER — Ambulatory Visit (INDEPENDENT_AMBULATORY_CARE_PROVIDER_SITE_OTHER): Payer: 59 | Admitting: Internal Medicine

## 2018-07-04 ENCOUNTER — Encounter (HOSPITAL_COMMUNITY): Payer: Self-pay | Admitting: *Deleted

## 2018-07-04 VITALS — BP 134/108 | HR 85 | Wt 178.7 lb

## 2018-07-04 DIAGNOSIS — Z3A32 32 weeks gestation of pregnancy: Secondary | ICD-10-CM | POA: Diagnosis not present

## 2018-07-04 DIAGNOSIS — O133 Gestational [pregnancy-induced] hypertension without significant proteinuria, third trimester: Secondary | ICD-10-CM | POA: Diagnosis not present

## 2018-07-04 DIAGNOSIS — R0989 Other specified symptoms and signs involving the circulatory and respiratory systems: Secondary | ICD-10-CM

## 2018-07-04 DIAGNOSIS — R030 Elevated blood-pressure reading, without diagnosis of hypertension: Secondary | ICD-10-CM | POA: Diagnosis present

## 2018-07-04 DIAGNOSIS — O139 Gestational [pregnancy-induced] hypertension without significant proteinuria, unspecified trimester: Secondary | ICD-10-CM | POA: Diagnosis present

## 2018-07-04 DIAGNOSIS — O131 Gestational [pregnancy-induced] hypertension without significant proteinuria, first trimester: Secondary | ICD-10-CM

## 2018-07-04 DIAGNOSIS — O163 Unspecified maternal hypertension, third trimester: Secondary | ICD-10-CM

## 2018-07-04 DIAGNOSIS — Z87891 Personal history of nicotine dependence: Secondary | ICD-10-CM | POA: Diagnosis not present

## 2018-07-04 LAB — COMPREHENSIVE METABOLIC PANEL
ALK PHOS: 58 U/L (ref 38–126)
ALT: 23 U/L (ref 0–44)
AST: 22 U/L (ref 15–41)
Albumin: 3.4 g/dL — ABNORMAL LOW (ref 3.5–5.0)
Anion gap: 10 (ref 5–15)
BILIRUBIN TOTAL: 0.4 mg/dL (ref 0.3–1.2)
BUN: 9 mg/dL (ref 6–20)
CALCIUM: 8.6 mg/dL — AB (ref 8.9–10.3)
CO2: 20 mmol/L — ABNORMAL LOW (ref 22–32)
CREATININE: 0.49 mg/dL (ref 0.44–1.00)
Chloride: 103 mmol/L (ref 98–111)
GFR calc non Af Amer: >60 mL/min (ref >60)
GLUCOSE: 86 mg/dL (ref 70–99)
Potassium: 3.8 mmol/L (ref 3.5–5.1)
Sodium: 133 mmol/L — ABNORMAL LOW (ref 135–145)
TOTAL PROTEIN: 6.9 g/dL (ref 6.5–8.1)

## 2018-07-04 LAB — CBC
HEMATOCRIT: 31.8 % — AB (ref 36.0–46.0)
HEMOGLOBIN: 11.4 g/dL — AB (ref 12.0–15.0)
MCH: 32.8 pg (ref 26.0–34.0)
MCHC: 35.8 g/dL (ref 30.0–36.0)
MCV: 91.4 fL (ref 80.0–100.0)
Platelets: 195 10*3/uL (ref 150–400)
RBC: 3.48 MIL/uL — ABNORMAL LOW (ref 3.87–5.11)
RDW: 12.5 % (ref 11.5–15.5)
WBC: 12.6 10*3/uL — ABNORMAL HIGH (ref 4.0–10.5)
nRBC: 0 % (ref 0.0–0.2)

## 2018-07-04 LAB — PROTEIN / CREATININE RATIO, URINE
Creatinine, Urine: 81 mg/dL
PROTEIN CREATININE RATIO: 0.09 mg/mg{creat} (ref 0.00–0.15)
Total Protein, Urine: 7 mg/dL

## 2018-07-04 NOTE — MAU Note (Signed)
Sent up from clinic. BP elevated.  Saw some darks spots earlier, no longer an issues.  Denies HA., epigastric pain or increase in swelling.  Hx of migraines prior to pregnancy, would frequently have visual changes prior to onset.

## 2018-07-04 NOTE — Discharge Instructions (Signed)

## 2018-07-04 NOTE — MAU Provider Note (Signed)
History     CSN: [id number]  Arrival date and time: 07/04/18 1701   First Provider Initiated Contact with Patient 07/04/18 1754      Chief Complaint  Patient presents with  . Hypertension   HPI   Ms.Crystal Terry is a 27 y.o.female G1P0000 @ 9w2dhere with elevated BP. She was seen in the clinic today and had elevated BP readings. She was sent here for further evaluation. Says she does not have a history of HTN. Today she had a 20 minute period of dark spots in her vision. None now. No headaches today. Had one Headache last week that went away with tylenol. + vaginal bleeding.   OB History    Gravida  1   Para  0   Term  0   Preterm  0   AB  0   Living  0     SAB  0   TAB  0   Ectopic  0   Multiple  0   Live Births  0           Past Medical History:  Diagnosis Date  . Asthma   . Vaginal Pap smear, abnormal     Past Surgical History:  Procedure Laterality Date  . NO PAST SURGERIES      Family History  Problem Relation Age of Onset  . Diabetes Maternal Grandmother   . Heart disease Maternal Grandmother   . Diabetes Maternal Grandfather   . Heart disease Maternal Grandfather     Social History   Tobacco Use  . Smoking status: Former SResearch scientist (life sciences) . Smokeless tobacco: Never Used  Substance Use Topics  . Alcohol use: Not Currently  . Drug use: No    Allergies: No Known Allergies  Medications Prior to Admission  Medication Sig Dispense Refill Last Dose  . Prenatal Vit-Fe Fumarate-FA (MULTIVITAMIN-PRENATAL) 27-0.8 MG TABS tablet Take 1 tablet by mouth daily at 12 noon.   Taking  . zolpidem (AMBIEN) 5 MG tablet Take 1 tablet (5 mg total) by mouth at bedtime as needed for sleep. 7 tablet 0 Taking   Results for orders placed or performed during the hospital encounter of 07/04/18 (from the past 48 hour(s))  CBC     Status: Abnormal   Collection Time: 07/04/18  5:41 PM  Result Value Ref Range   WBC 12.6 (H) 4.0 - 10.5 K/uL   RBC 3.48 (L) 3.87 -  5.11 MIL/uL   Hemoglobin 11.4 (L) 12.0 - 15.0 g/dL   HCT 31.8 (L) 36.0 - 46.0 %   MCV 91.4 80.0 - 100.0 fL   MCH 32.8 26.0 - 34.0 pg   MCHC 35.8 30.0 - 36.0 g/dL   RDW 12.5 11.5 - 15.5 %   Platelets 195 150 - 400 K/uL   nRBC 0.0 0.0 - 0.2 %    Comment: [facility] [address], [place] [address] Comprehensive metabolic panel     Status: Abnormal   Collection Time: 07/04/18  5:41 PM  Result Value Ref Range   Sodium 133 (L) 135 - 145 mmol/L   Potassium 3.8 3.5 - 5.1 mmol/L   Chloride 103 98 - 111 mmol/L   CO2 20 (L) 22 - 32 mmol/L   Glucose, Bld 86 70 - 99 mg/dL   BUN 9 6 - 20 mg/dL   Creatinine, Ser 0.49 0.44 - 1.00 mg/dL   Calcium 8.6 (L) 8.9 - 10.3 mg/dL   Total Protein 6.9 6.5 -  8.1 g/dL   Albumin 3.4 (L) 3.5 - 5.0 g/dL   AST 22 15 - 41 U/L   ALT 23 0 - 44 U/L   Alkaline Phosphatase 58 38 - 126 U/L   Total Bilirubin 0.4 0.3 - 1.2 mg/dL   GFR calc non Af Amer >60 >60 mL/min   GFR calc Af Amer >60 >60 mL/min    Comment: (NOTE) The eGFR has been calculated using the CKD EPI equation. This calculation has not been validated in all clinical situations. eGFR's persistently <60 mL/min signify possible Chronic Kidney Disease.    Anion gap 10 5 - 15    Comment: Performed at [facility], [address] [place], [place] [address]  Protein / creatinine ratio, urine     Status: None   Collection Time: 07/04/18  6:04 PM  Result Value Ref Range   Creatinine, Urine 81.00 mg/dL   Total Protein, Urine 7 mg/dL    Comment: NO NORMAL RANGE ESTABLISHED FOR THIS TEST   Protein Creatinine Ratio 0.09 0.00 - 0.15 mg/mg[Cre]    Comment: Performed at [facility], [address], [place], [place] [address]   Review of Systems  Eyes: Negative for photophobia and visual disturbance.  Gastrointestinal: Negative for abdominal pain.  Neurological: Negative for headaches.   Physical Exam   Blood pressure 115/64, pulse 68, last menstrual period 11/13/2017.    Patient Vitals for the past 24 hrs:  BP Pulse  07/04/18 1928 119/66 -  07/04/18 1900 119/66 84  07/04/18 1845 (!) 115/50 67  07/04/18 1830 115/64 68  07/04/18 1815 119/64 67  07/04/18 1800 106/72 83  07/04/18 1745 116/65 80   Physical Exam  Constitutional: She is oriented to person, place, and time. She appears well-developed and well-nourished. No distress.  HENT:  Head: Normocephalic.  Eyes: Pupils are equal, round, and reactive to light.  GI: Soft. She exhibits no distension. There is no tenderness. There is no rebound and no guarding.  Musculoskeletal: Normal range of motion.  Neurological: She is alert and oriented to person, place, and time.  Skin: Skin is warm. She is not diaphoretic.  Psychiatric: Her behavior is normal.   Fetal Tracing: Baseline: 135 bpm Variability: Moderate  Accelerations: 15x15 Decelerations: None Toco: None   MAU Course  Procedures  None  MDM First elevated BP was 6/26: 142/82 Clinic BP 141/62 & 134/108 BP's in MAU WNL Labs WNL   Assessment and Plan   A:  1. Gestational hypertension, first trimester   2. Labile blood pressure     P:  Discharge home in stable condition Urgent message sent to [facility] for patient to start Antenatal testing; BP check also. Strict return precautions PreE precautions    Crystal Terry, Artist Pais, NP 07/04/2018 8:03 PM

## 2018-07-04 NOTE — Progress Notes (Signed)
Patient presented for 32 week lrob appointment. Noted to have elevated BPs by nurse on subsequent checks. Endorsed vision changes today. Given presentation, will send to MAU for PIH labs and BP trend.   Marcy Siren, D.O. [facility] Fellow, [facility], [facility] 07/04/2018, 5:22 PM

## 2018-07-11 ENCOUNTER — Ambulatory Visit (INDEPENDENT_AMBULATORY_CARE_PROVIDER_SITE_OTHER): Payer: 59 | Admitting: *Deleted

## 2018-07-11 ENCOUNTER — Ambulatory Visit: Payer: Self-pay

## 2018-07-11 ENCOUNTER — Encounter: Payer: Self-pay | Admitting: *Deleted

## 2018-07-11 VITALS — BP 120/63 | HR 70 | Wt 180.6 lb

## 2018-07-11 DIAGNOSIS — O163 Unspecified maternal hypertension, third trimester: Secondary | ICD-10-CM | POA: Diagnosis not present

## 2018-07-11 NOTE — Progress Notes (Signed)

## 2018-07-18 ENCOUNTER — Encounter: Payer: 59 | Admitting: Family Medicine

## 2018-07-18 ENCOUNTER — Other Ambulatory Visit: Payer: 59

## 2018-07-20 ENCOUNTER — Ambulatory Visit (INDEPENDENT_AMBULATORY_CARE_PROVIDER_SITE_OTHER): Payer: 59 | Admitting: Obstetrics & Gynecology

## 2018-07-20 ENCOUNTER — Ambulatory Visit: Payer: Self-pay

## 2018-07-20 ENCOUNTER — Ambulatory Visit (INDEPENDENT_AMBULATORY_CARE_PROVIDER_SITE_OTHER): Payer: 59 | Admitting: *Deleted

## 2018-07-20 VITALS — BP 123/60 | HR 76 | Wt 182.6 lb

## 2018-07-20 DIAGNOSIS — O163 Unspecified maternal hypertension, third trimester: Secondary | ICD-10-CM

## 2018-07-20 DIAGNOSIS — O0993 Supervision of high risk pregnancy, unspecified, third trimester: Secondary | ICD-10-CM

## 2018-07-20 DIAGNOSIS — O139 Gestational [pregnancy-induced] hypertension without significant proteinuria, unspecified trimester: Secondary | ICD-10-CM

## 2018-07-20 DIAGNOSIS — O133 Gestational [pregnancy-induced] hypertension without significant proteinuria, third trimester: Secondary | ICD-10-CM

## 2018-07-20 NOTE — Progress Notes (Signed)
Pt informed that the ultrasound is considered a limited OB ultrasound and is not intended to be a complete ultrasound exam.  Patient also informed that the ultrasound is not being completed with the intent of assessing for fetal or placental anomalies or any pelvic abnormalities.  Explained that the purpose of today's ultrasound is to assess for presentation, BPP and amniotic fluid volume.  Patient acknowledges the purpose of the exam and the limitations of the study.   US for growth and BPP scheduled on 11/19   Diane Day [facility]  07/20/18

## 2018-07-20 NOTE — Progress Notes (Signed)
   PRENATAL VISIT NOTE  Subjective:  Crystal Terry is a 27 y.o. G1P0000 at 6524w4d being seen today for ongoing prenatal care.  She is currently monitored for the following issues for this high-risk pregnancy and has Supervision of high-risk pregnancy; Vanishing twin syndrome; Family history of fragile X syndrome; LGSIL on Pap smear of cervix; Cleft lip; Cleft lip and palate, fetal, affecting care of mother, antepartum, single gestation; Scabies; and Transient hypertension of pregnancy, antepartum on their problem list.  Patient reports no complaints.   .  .   . Denies leaking of fluid.   The following portions of the patient's history were reviewed and updated as appropriate: allergies, current medications, past family history, past medical history, past social history, past surgical history and problem list. Problem list updated.  Objective:  There were no vitals filed for this visit.  Fetal Status:           General:  Alert, oriented and cooperative. Patient is in no acute distress.  Skin: Skin is warm and dry. No rash noted.   Cardiovascular: Normal heart rate noted  Respiratory: Normal respiratory effort, no problems with respiration noted  Abdomen: Soft, gravid, appropriate for gestational age.        Pelvic: Cervical exam deferred        Extremities: Normal range of motion.     Mental Status: Normal mood and affect. Normal behavior. Normal judgment and thought content.   Assessment and Plan:  Pregnancy: G1P0000 at 4024w4d  1. Transient hypertension of pregnancy, antepartum Had one elevated BP at 14 weeks and then at 32 weeks, otherwise normotensive!!!!  Normal BP today. Patient denies any headaches, visual symptoms, RUQ/epigastric pain.  Plan to continue monitoring. If patient has elevated BP again or develops signs/symptoms of preeclampsia, will consider delivery ~37 weeks. If still normal, will proceed with IOL at 39 weeks. NST performed today was reviewed and was found to be  reactive. Subsequent BPP performed today was also reviewed and was found to be 10/10. AFI was also normal. Continue recommended antenatal testing and prenatal care. - US MFM FETAL BPP WO NON STRESS; Future  2. Supervision of high risk pregnancy in third trimester Preterm labor symptoms and general obstetric precautions including but not limited to vaginal bleeding, contractions, leaking of fluid and fetal movement were reviewed in detail with the patient. Please refer to After Visit Summary for other counseling recommendations.  Return in about 1 week (around 07/27/2018) for NST only and HOB; 2 weeks for NST/BPP and HOB.   Future Appointments  Date Time Provider Department Center  07/26/2018  9:30 AM [facility] US 5 [facility]    Jaynie CollinsUgonna Kyrielle Urbanski, MD

## 2018-07-26 ENCOUNTER — Encounter (HOSPITAL_COMMUNITY): Payer: Self-pay

## 2018-07-26 ENCOUNTER — Ambulatory Visit (HOSPITAL_COMMUNITY)
Admission: RE | Admit: 2018-07-26 | Discharge: 2018-07-26 | Disposition: A | Discharge status: Home / Self Care | Payer: 59 | Source: Ambulatory Visit | Attending: Obstetrics & Gynecology | Admitting: Obstetrics & Gynecology

## 2018-07-26 DIAGNOSIS — O139 Gestational [pregnancy-induced] hypertension without significant proteinuria, unspecified trimester: Secondary | ICD-10-CM

## 2018-07-26 DIAGNOSIS — J45909 Unspecified asthma, uncomplicated: Secondary | ICD-10-CM | POA: Insufficient documentation

## 2018-07-26 DIAGNOSIS — O358XX0 Maternal care for other (suspected) fetal abnormality and damage, not applicable or unspecified: Secondary | ICD-10-CM | POA: Diagnosis not present

## 2018-07-26 DIAGNOSIS — Z3A35 35 weeks gestation of pregnancy: Secondary | ICD-10-CM | POA: Diagnosis not present

## 2018-07-26 DIAGNOSIS — O99513 Diseases of the respiratory system complicating pregnancy, third trimester: Secondary | ICD-10-CM | POA: Diagnosis not present

## 2018-07-26 DIAGNOSIS — Z362 Encounter for other antenatal screening follow-up: Secondary | ICD-10-CM | POA: Diagnosis not present

## 2018-07-26 DIAGNOSIS — Q369 Cleft lip, unilateral: Secondary | ICD-10-CM

## 2018-07-26 DIAGNOSIS — O163 Unspecified maternal hypertension, third trimester: Secondary | ICD-10-CM | POA: Insufficient documentation

## 2018-07-28 ENCOUNTER — Other Ambulatory Visit: Payer: 59

## 2018-08-01 ENCOUNTER — Encounter: Payer: 59 | Admitting: Advanced Practice Midwife

## 2018-08-03 ENCOUNTER — Other Ambulatory Visit: Payer: 59

## 2018-08-03 ENCOUNTER — Encounter: Payer: 59 | Admitting: Internal Medicine

## 2018-11-19 ENCOUNTER — Encounter (HOSPITAL_COMMUNITY): Payer: Self-pay

## 2018-12-01 NOTE — Telephone Encounter (Signed)
Opened in error

## 2019-05-02 IMAGING — US US MFM OB FOLLOW-UP
1 series · 14 of 28 positions shown · non-contrast
Comparison: none

[Series 1: us mfm ob follow-up · 14 of 33 slices shown]
[im 2/33]
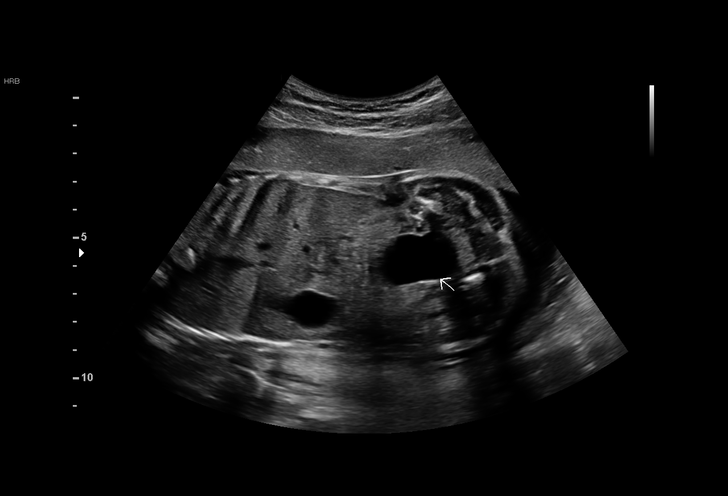
[im 4/33]
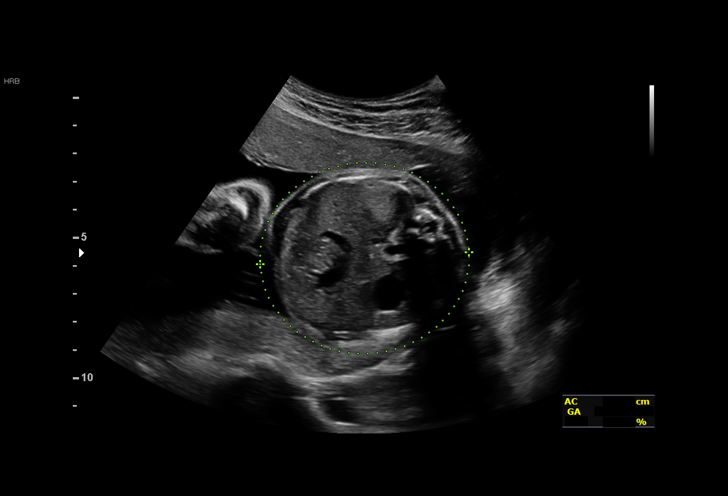
[im 6/33]
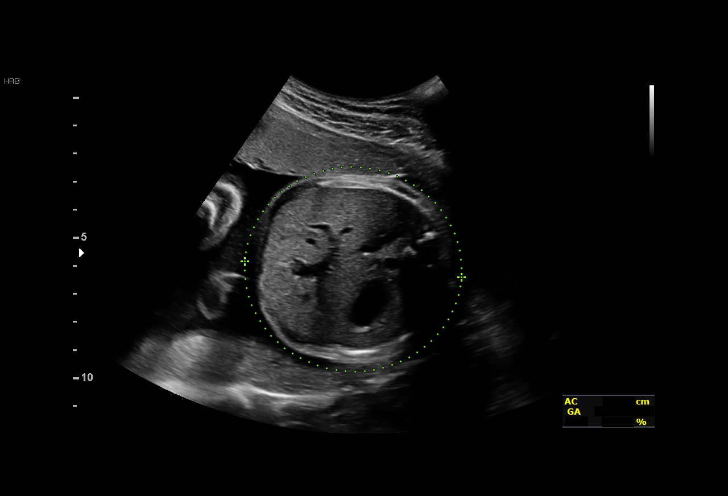
[im 9/33]
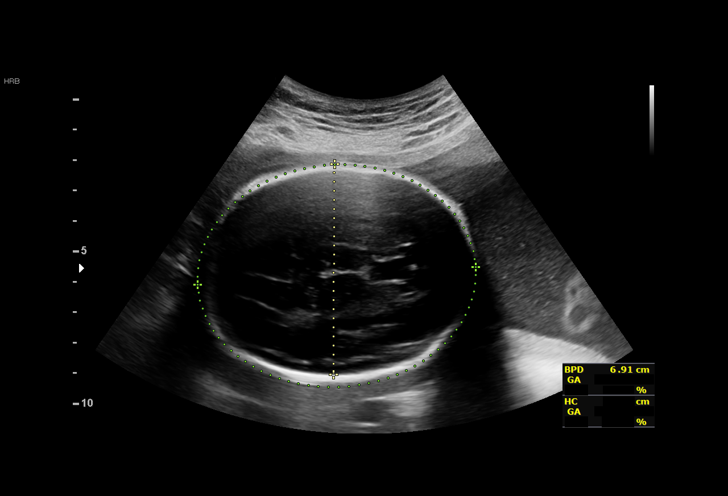
[im 11/33]
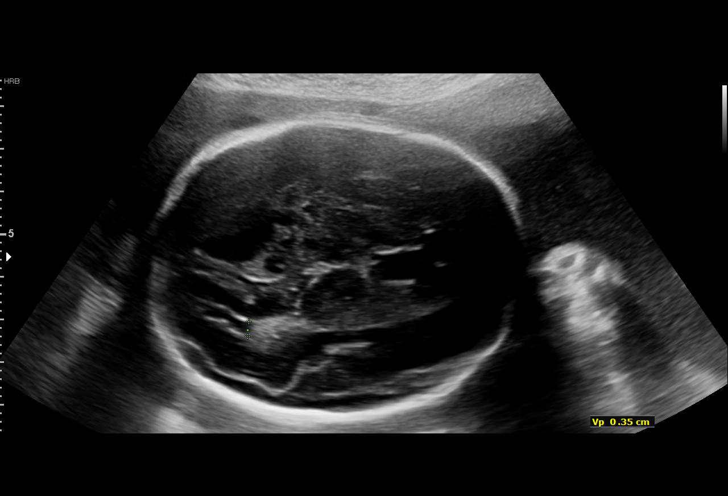
[im 14/33]
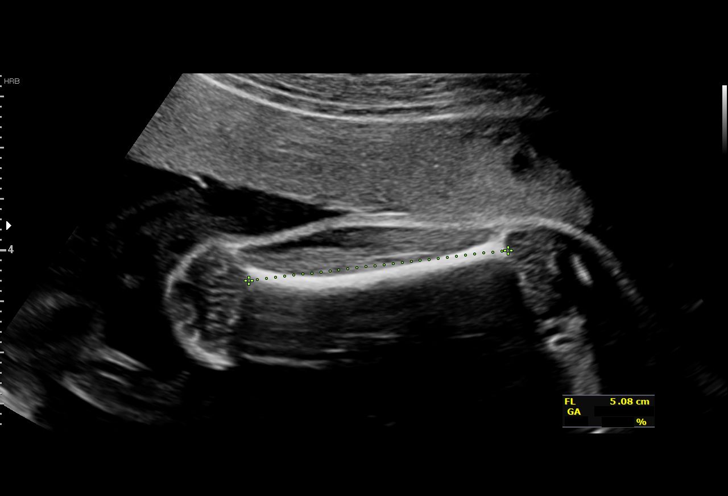
[im 16/33]
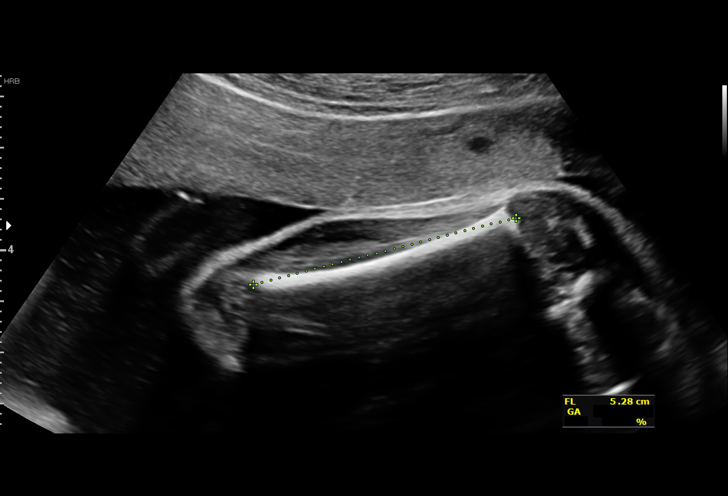
[im 18/33]
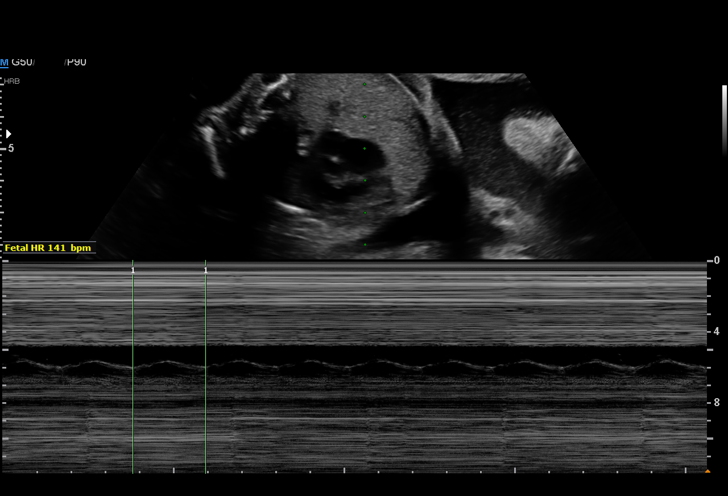
[im 21/33]
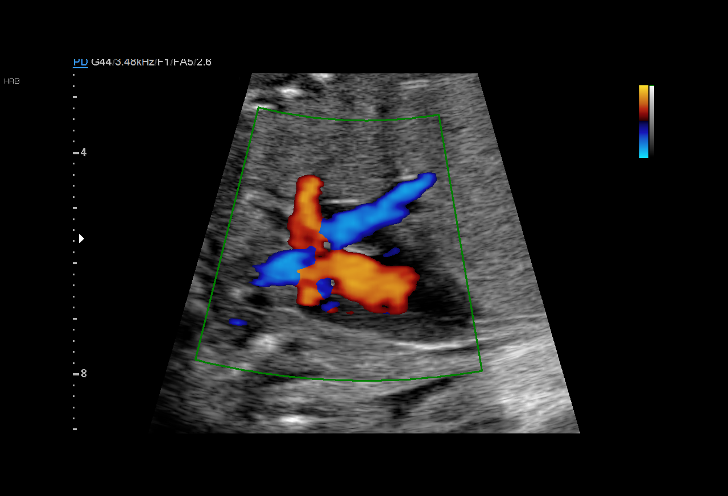
[im 23/33]
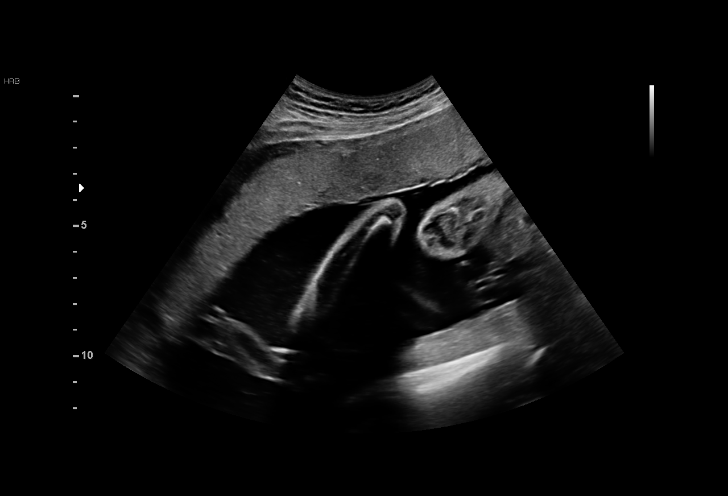
[im 25/33]
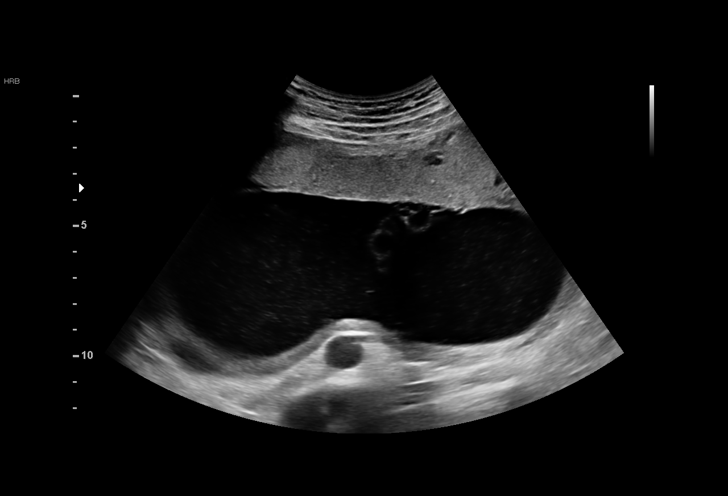
[im 28/33]
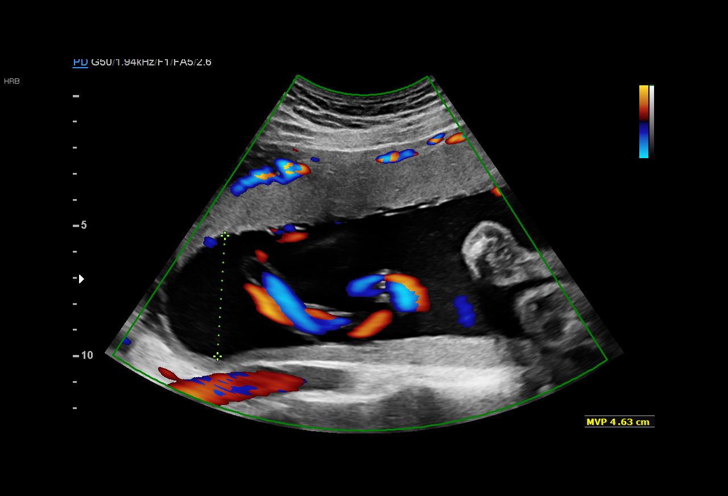
[im 30/33]
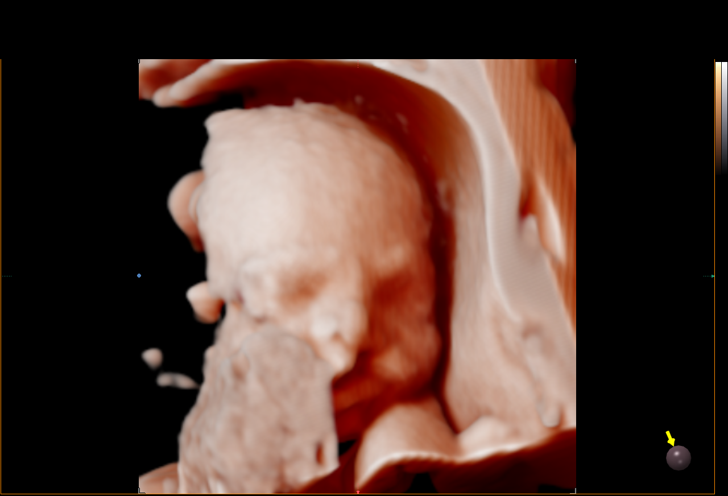
[im 33/33]
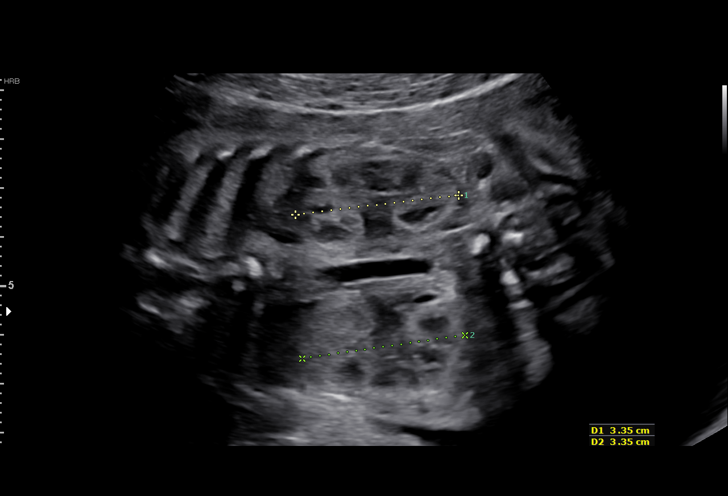

[14 of 28 positions shown; findings below may reference images not displayed]

Indications

27 weeks gestation of pregnancy
Cleft lip, unilateral (left)
Antenatal follow-up for nonvisualized fetal
anatomy
Vital Signs

BMI:
Fetal Evaluation

Num Of Fetuses:         1
Fetal Heart Rate(bpm):  141
Cardiac Activity:       Observed
Presentation:           Transverse, head to maternal right
Placenta:               Anterior
P. Cord Insertion:      Previously Visualized

Amniotic Fluid
AFI FV:      Within normal limits

Largest Pocket(cm)
4.63
Biometry

BPD:      69.5  mm     G. Age:  27w 6d         56  %    CI:        74.64   %    70 - 86
FL/HC:      20.4   %    18.6 -
HC:      255.3  mm     G. Age:  27w 5d         31  %    HC/AC:      1.11        1.05 -
AC:      229.3  mm     G. Age:  27w 2d         38  %    FL/BPD:     74.8   %    71 - 87
FL:         52  mm     G. Age:  27w 5d         45  %    FL/AC:      22.7   %    20 - 24
HUM:      48.8  mm     G. Age:  28w 5d         72  %
LV:        3.5  mm

Est. FW:    9597  gm      2 lb 6 oz     54  %
OB History

Gravidity:    1
Gestational Age

LMP:           28w 5d        Date:  11/11/17                 EDD:   08/18/18
U/S Today:     27w 5d                                        EDD:   08/25/18
Best:          27w 3d     Det. By:  Early Ultrasound         EDD:   08/27/18
(01/04/18)
Anatomy

Cranium:               Appears normal         LVOT:                   Appears normal
Cavum:                 Appears normal         Aortic Arch:            Previously seen
Ventricles:            Appears normal         Ductal Arch:            Previously seen
Choroid Plexus:        Previously seen        Diaphragm:              Appears normal
Cerebellum:            Previously seen        Stomach:                Appears normal, left
sided
Posterior Fossa:       Previously seen        Abdomen:                Appears normal
Nuchal Fold:           Previously seen        Abdominal Wall:         Previously seen
Face:                  Appears normal         Cord Vessels:           Previously seen
(orbits and profile)
Lips:                  Abnormal, see          Kidneys:                Appear normal
comments
Palate:                Not visualized         Bladder:                Appears normal
Thoracic:              Appears normal         Spine:                  Previously seen
Heart:                 Previously seen        Upper Extremities:      Previously seen
RVOT:                  Previously seen        Lower Extremities:      Previously seen

Other:  Heels and left 5th digit previously seen.
Cervix Uterus Adnexa

Cervix
Not visualized (advanced GA >33wks)
Impression

Fetal cleft lip. Normal microarray results on amniocentesis.
Fetal echocardiography: Normal study.
Amniotic fluid is normal and good fetal activity is seen. Fetal
growth is appropriate for gestational age. Cleft lip is seen
again and I cannot comment on the palate.
We reassured the patient of normal fetal growth.
Recommendations

-An appointment was made for her to return in 4 weeks for
fetal growth assessment.
-Consultation with cleft-lip team.

## 2019-06-21 IMAGING — US US FETAL BPP W/ NON-STRESS
1 series · 13 of 14 positions shown · non-contrast
Comparison: none

[Series 1: us fetal bpp w/nonstress · 14 acquisitions, 13 frames shown]
[im 1/14]
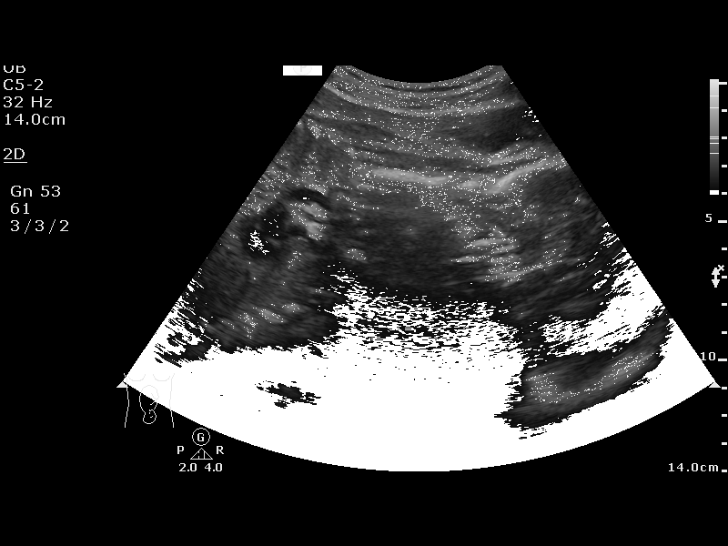
[im 2/14]
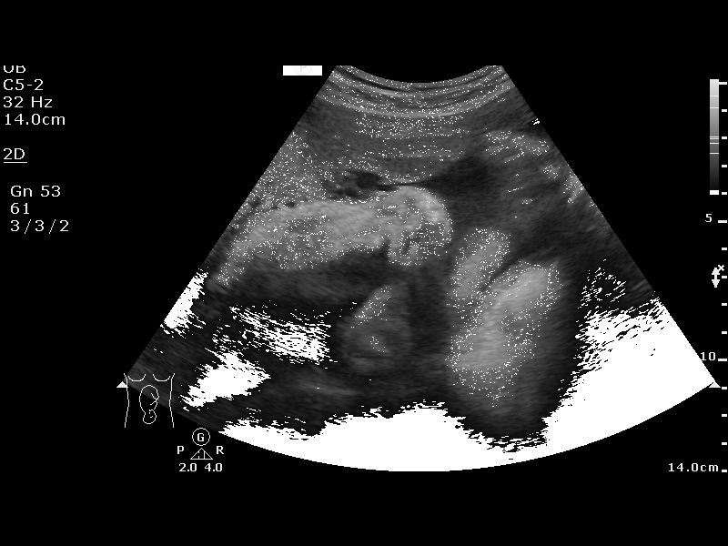
[im 3/14]
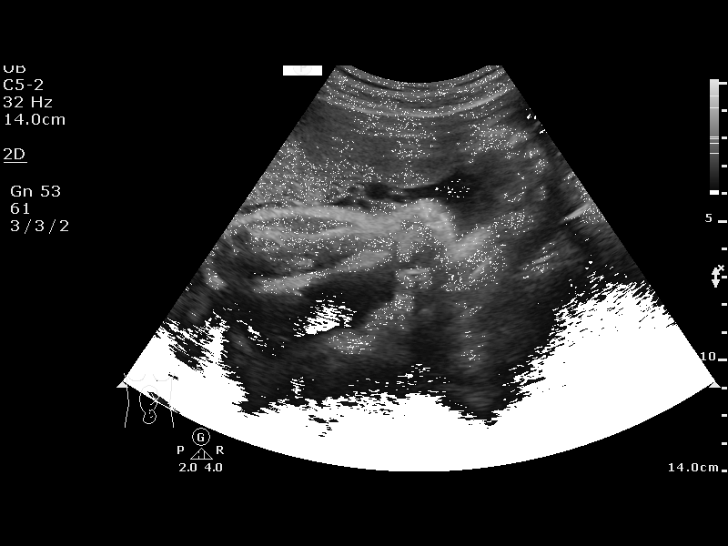
[im 4/14]
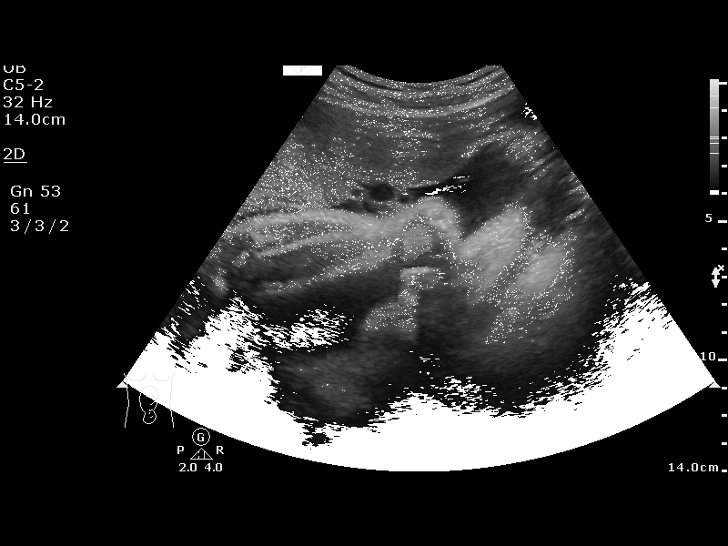
[im 5/14]
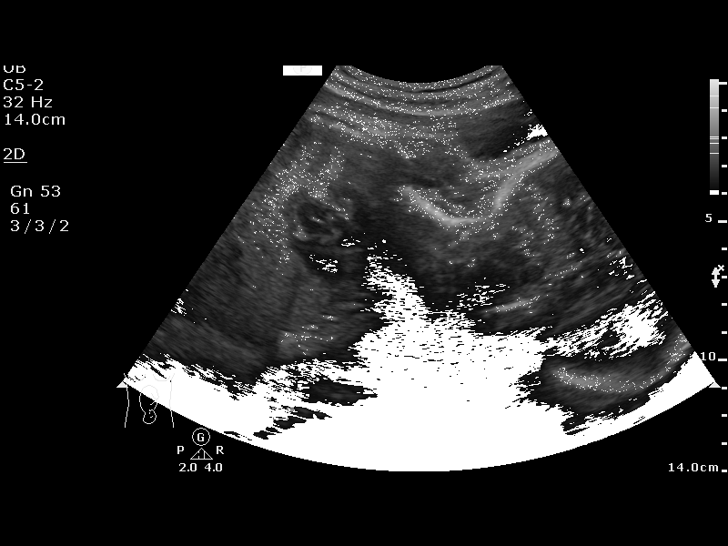
[im 6/14]
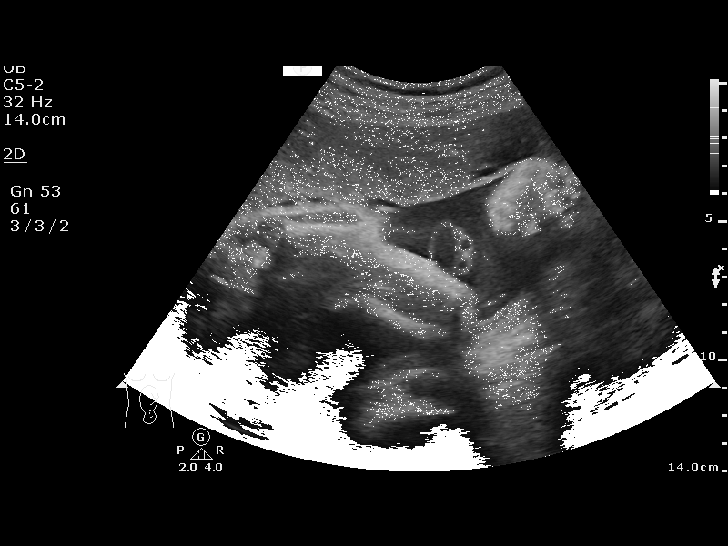
[im 8/14]
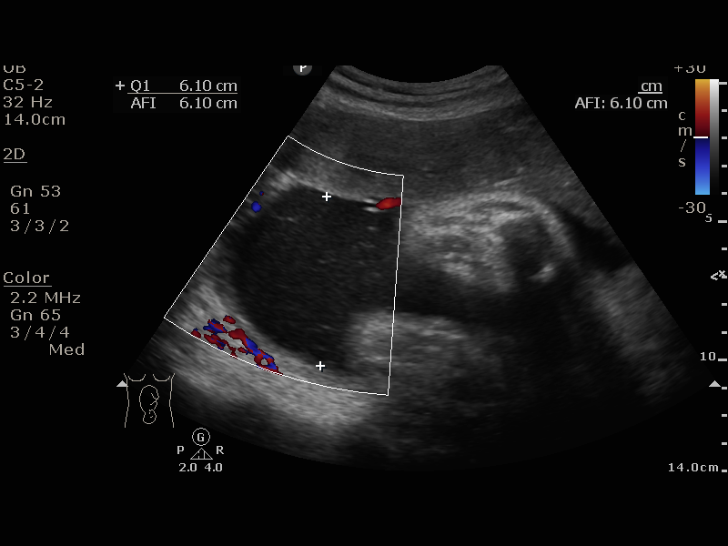
[im 9/14]
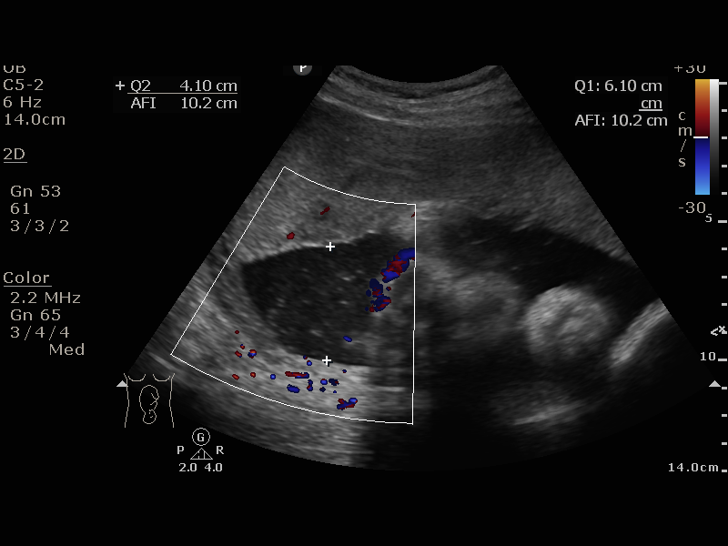
[im 10/14]
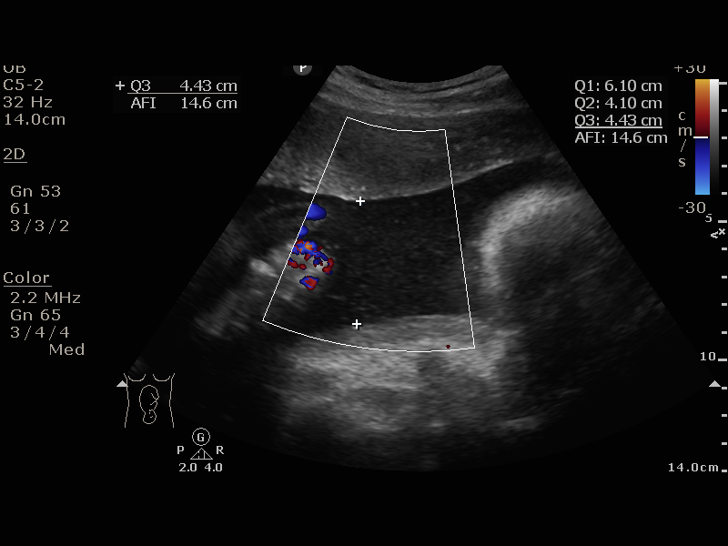
[im 11/14]
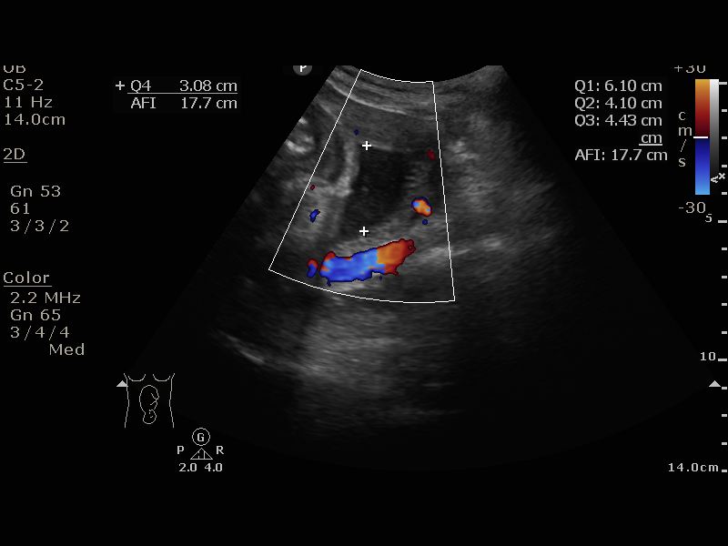
[im 12/14]
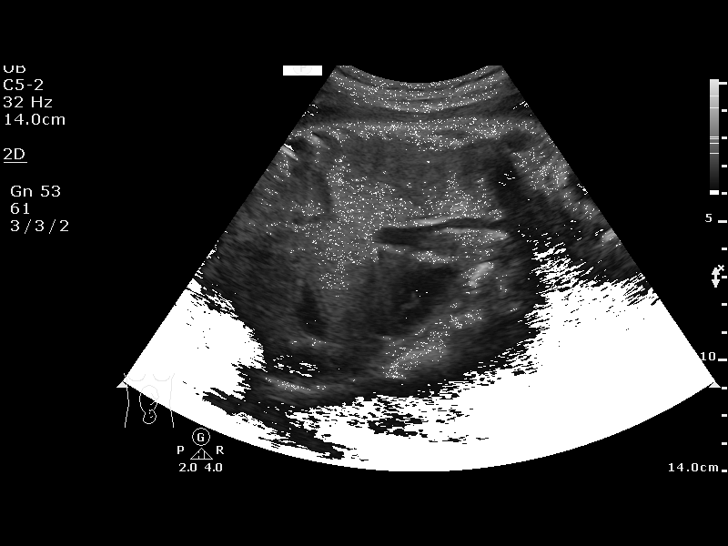
[im 13/14]
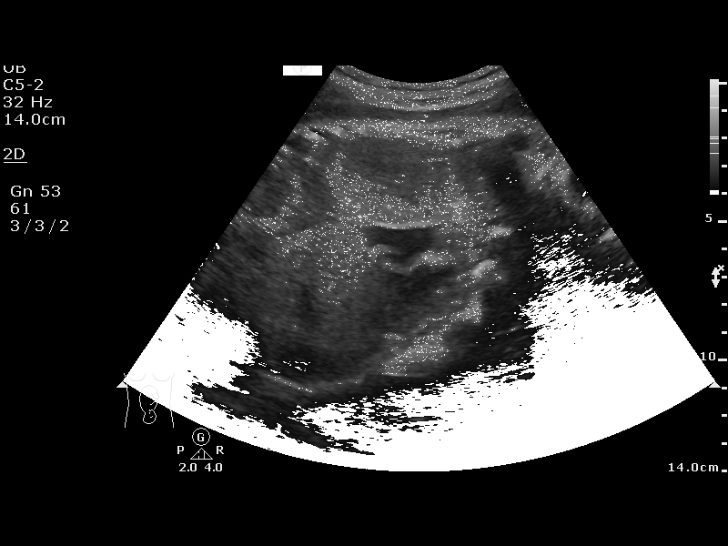
[im 14/14]
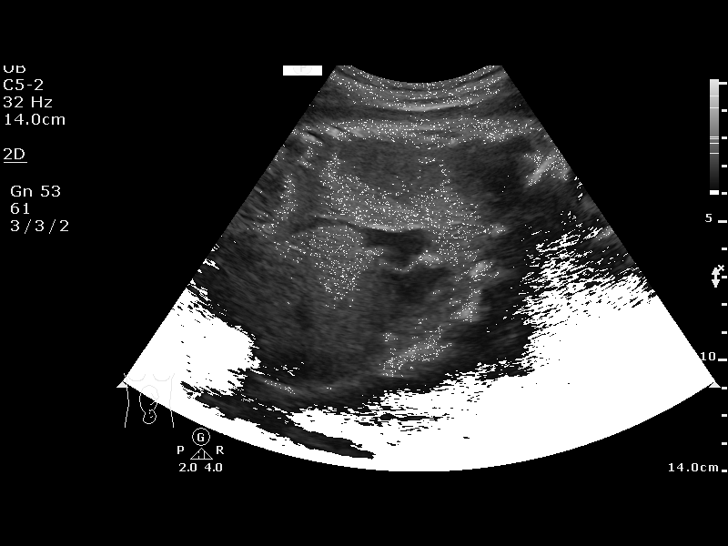

[13 of 14 positions shown; findings below may reference images not displayed]

OB/Gyn Clinic
 Attending:        Ketels Rosiers         Location:         [facility]
                                                            [REDACTED]

  1  US FETAL BPP W/NONSTRESS             76818.4      SAYGUN FAZLI
 ----------------------------------------------------------------------

 ----------------------------------------------------------------------
Service(s) Provided

 ----------------------------------------------------------------------
Indications

  34 weeks gestation of pregnancy
  Unspecified maternal hypertension, third
  trimester
 ----------------------------------------------------------------------
Vital Signs

                                                Height:        6'0"
Fetal Evaluation

 Num Of Fetuses:         1
 Preg. Location:         Intrauterine
 Fetal Heart Rate(bpm):  145
 Cardiac Activity:       Observed
 Presentation:           Cephalic

 Amniotic Fluid
 AFI FV:      Within normal limits

 AFI Sum(cm)     %Tile       Largest Pocket(cm)
 17.71           65
 RUQ(cm)       RLQ(cm)       LUQ(cm)        LLQ(cm)

Biophysical Evaluation

 Amniotic F.V:   Pocket => 2 cm two         F. Tone:        Observed
                 planes
 F. Movement:    Observed                   N.S.T:          Reactive
 F. Breathing:   Observed                   Score:          [DATE]
OB History

 Gravidity:    1
Gestational Age

 LMP:           35w 6d        Date:  11/11/17                 EDD:   08/18/18
 Best:          34w 4d     Det. By:  Early Ultrasound         EDD:   08/27/18
                                     (01/04/18)
Impression

 Normal amniotic fluid volume. Antenatal testing is reassuring.
 BPP [DATE].
Recommendations

 Continue weekly antenatal testing till delivery.
             Pat, Eniis

## 2023-01-01 DIAGNOSIS — Z87891 Personal history of nicotine dependence: Secondary | ICD-10-CM | POA: Diagnosis not present

## 2023-01-01 DIAGNOSIS — Z1321 Encounter for screening for nutritional disorder: Secondary | ICD-10-CM | POA: Diagnosis not present

## 2023-01-01 DIAGNOSIS — Z130 Encounter for screening for diseases of the blood and blood-forming organs and certain disorders involving the immune mechanism: Secondary | ICD-10-CM | POA: Diagnosis not present

## 2023-01-01 DIAGNOSIS — Z1283 Encounter for screening for malignant neoplasm of skin: Secondary | ICD-10-CM | POA: Diagnosis not present

## 2023-01-01 DIAGNOSIS — Z1329 Encounter for screening for other suspected endocrine disorder: Secondary | ICD-10-CM | POA: Diagnosis not present

## 2023-01-01 DIAGNOSIS — Z113 Encounter for screening for infections with a predominantly sexual mode of transmission: Secondary | ICD-10-CM | POA: Diagnosis not present

## 2023-01-01 DIAGNOSIS — Z13228 Encounter for screening for other metabolic disorders: Secondary | ICD-10-CM | POA: Diagnosis not present

## 2023-01-01 DIAGNOSIS — Z0000 Encounter for general adult medical examination without abnormal findings: Secondary | ICD-10-CM | POA: Diagnosis not present

## 2023-01-01 DIAGNOSIS — Z124 Encounter for screening for malignant neoplasm of cervix: Secondary | ICD-10-CM | POA: Diagnosis not present

## 2023-01-01 DIAGNOSIS — Z7689 Persons encountering health services in other specified circumstances: Secondary | ICD-10-CM | POA: Diagnosis not present

## 2023-01-05 DIAGNOSIS — J069 Acute upper respiratory infection, unspecified: Secondary | ICD-10-CM | POA: Diagnosis not present

## 2023-01-05 DIAGNOSIS — M791 Myalgia, unspecified site: Secondary | ICD-10-CM | POA: Diagnosis not present

## 2023-01-05 DIAGNOSIS — H66001 Acute suppurative otitis media without spontaneous rupture of ear drum, right ear: Secondary | ICD-10-CM | POA: Diagnosis not present

## 2023-01-05 DIAGNOSIS — Z6821 Body mass index (BMI) 21.0-21.9, adult: Secondary | ICD-10-CM | POA: Diagnosis not present

## 2024-01-07 DIAGNOSIS — Z131 Encounter for screening for diabetes mellitus: Secondary | ICD-10-CM | POA: Diagnosis not present

## 2024-01-07 DIAGNOSIS — Z13220 Encounter for screening for lipoid disorders: Secondary | ICD-10-CM | POA: Diagnosis not present

## 2024-01-07 DIAGNOSIS — M542 Cervicalgia: Secondary | ICD-10-CM | POA: Diagnosis not present

## 2024-01-07 DIAGNOSIS — Z1329 Encounter for screening for other suspected endocrine disorder: Secondary | ICD-10-CM | POA: Diagnosis not present

## 2024-01-07 DIAGNOSIS — Z0000 Encounter for general adult medical examination without abnormal findings: Secondary | ICD-10-CM | POA: Diagnosis not present

## 2024-01-07 DIAGNOSIS — Z13228 Encounter for screening for other metabolic disorders: Secondary | ICD-10-CM | POA: Diagnosis not present

## 2024-01-07 DIAGNOSIS — Z130 Encounter for screening for diseases of the blood and blood-forming organs and certain disorders involving the immune mechanism: Secondary | ICD-10-CM | POA: Diagnosis not present

## 2024-01-07 DIAGNOSIS — Z87891 Personal history of nicotine dependence: Secondary | ICD-10-CM | POA: Diagnosis not present

## 2024-01-20 DIAGNOSIS — M542 Cervicalgia: Secondary | ICD-10-CM | POA: Diagnosis not present

## 2024-01-20 DIAGNOSIS — R599 Enlarged lymph nodes, unspecified: Secondary | ICD-10-CM | POA: Diagnosis not present

## 2024-02-11 DIAGNOSIS — Z124 Encounter for screening for malignant neoplasm of cervix: Secondary | ICD-10-CM | POA: Diagnosis not present
